# Patient Record
Sex: Male | Born: 1935 | Race: White | Hispanic: No | Marital: Married | Smoking: Former smoker
Health system: Southern US, Community
[De-identification: ages and names within clinical notes are randomized; demographics above are authoritative.]

## PROBLEM LIST (undated history)

## (undated) DIAGNOSIS — R42 Dizziness and giddiness: Secondary | ICD-10-CM

## (undated) DIAGNOSIS — G4730 Sleep apnea, unspecified: Secondary | ICD-10-CM

## (undated) DIAGNOSIS — I739 Peripheral vascular disease, unspecified: Secondary | ICD-10-CM

## (undated) DIAGNOSIS — T4145XA Adverse effect of unspecified anesthetic, initial encounter: Secondary | ICD-10-CM

## (undated) DIAGNOSIS — M1990 Unspecified osteoarthritis, unspecified site: Secondary | ICD-10-CM

## (undated) DIAGNOSIS — M25471 Effusion, right ankle: Secondary | ICD-10-CM

## (undated) DIAGNOSIS — I2699 Other pulmonary embolism without acute cor pulmonale: Secondary | ICD-10-CM

## (undated) DIAGNOSIS — G709 Myoneural disorder, unspecified: Secondary | ICD-10-CM

## (undated) DIAGNOSIS — T8859XA Other complications of anesthesia, initial encounter: Secondary | ICD-10-CM

## (undated) DIAGNOSIS — N189 Chronic kidney disease, unspecified: Secondary | ICD-10-CM

## (undated) DIAGNOSIS — I10 Essential (primary) hypertension: Secondary | ICD-10-CM

## (undated) DIAGNOSIS — M25472 Effusion, left ankle: Secondary | ICD-10-CM

## (undated) HISTORY — PX: ELBOW SURGERY: SHX618

## (undated) HISTORY — PX: APPENDECTOMY: SHX54

## (undated) HISTORY — PX: CARDIAC CATHETERIZATION: SHX172

## (undated) HISTORY — PX: CHOLECYSTECTOMY: SHX55

## (undated) HISTORY — PX: KNEE ARTHROSCOPY: SHX127

## (undated) HISTORY — PX: JOINT REPLACEMENT: SHX530

## (undated) HISTORY — PX: HEMORROIDECTOMY: SUR656

## (undated) HISTORY — PX: CARPAL TUNNEL RELEASE: SHX101

---

## 1998-09-27 ENCOUNTER — Encounter: Admission: RE | Admit: 1998-09-27 | Discharge: 1998-12-26 | Payer: Self-pay | Admitting: Family Medicine

## 2000-01-17 ENCOUNTER — Encounter: Admission: RE | Admit: 2000-01-17 | Discharge: 2000-01-17 | Payer: Self-pay | Admitting: Family Medicine

## 2000-01-17 ENCOUNTER — Encounter: Payer: Self-pay | Admitting: Family Medicine

## 2001-08-03 ENCOUNTER — Emergency Department (HOSPITAL_COMMUNITY): Admission: EM | Admit: 2001-08-03 | Discharge: 2001-08-04 | Payer: Self-pay | Admitting: Emergency Medicine

## 2001-12-30 ENCOUNTER — Ambulatory Visit (HOSPITAL_COMMUNITY): Admission: RE | Admit: 2001-12-30 | Discharge: 2001-12-30 | Payer: Self-pay | Admitting: Gastroenterology

## 2001-12-30 ENCOUNTER — Encounter (INDEPENDENT_AMBULATORY_CARE_PROVIDER_SITE_OTHER): Payer: Self-pay | Admitting: *Deleted

## 2003-11-26 ENCOUNTER — Emergency Department (HOSPITAL_COMMUNITY): Admission: EM | Admit: 2003-11-26 | Discharge: 2003-11-26 | Payer: Self-pay | Admitting: Family Medicine

## 2003-11-29 ENCOUNTER — Emergency Department (HOSPITAL_COMMUNITY): Admission: AD | Admit: 2003-11-29 | Discharge: 2003-11-29 | Payer: Self-pay | Admitting: Family Medicine

## 2004-03-08 ENCOUNTER — Ambulatory Visit (HOSPITAL_COMMUNITY): Admission: RE | Admit: 2004-03-08 | Discharge: 2004-03-08 | Payer: Self-pay | Admitting: Chiropractic Medicine

## 2004-10-07 ENCOUNTER — Emergency Department (HOSPITAL_COMMUNITY): Admission: EM | Admit: 2004-10-07 | Discharge: 2004-10-07 | Payer: Self-pay | Admitting: Family Medicine

## 2005-04-04 ENCOUNTER — Encounter: Admission: RE | Admit: 2005-04-04 | Discharge: 2005-04-04 | Payer: Self-pay | Admitting: Family Medicine

## 2005-08-03 ENCOUNTER — Encounter: Admission: RE | Admit: 2005-08-03 | Discharge: 2005-08-03 | Payer: Self-pay | Admitting: Family Medicine

## 2006-04-19 ENCOUNTER — Encounter: Admission: RE | Admit: 2006-04-19 | Discharge: 2006-04-19 | Payer: Self-pay | Admitting: Family Medicine

## 2008-07-20 ENCOUNTER — Inpatient Hospital Stay (HOSPITAL_COMMUNITY): Admission: EM | Admit: 2008-07-20 | Discharge: 2008-07-23 | Payer: Self-pay | Admitting: Emergency Medicine

## 2009-10-13 ENCOUNTER — Inpatient Hospital Stay (HOSPITAL_COMMUNITY): Admission: RE | Admit: 2009-10-13 | Discharge: 2009-10-24 | Payer: Self-pay | Admitting: Orthopedic Surgery

## 2010-03-19 ENCOUNTER — Observation Stay (HOSPITAL_COMMUNITY): Admission: EM | Admit: 2010-03-19 | Discharge: 2010-03-22 | Payer: Self-pay | Admitting: Emergency Medicine

## 2010-03-21 ENCOUNTER — Encounter (INDEPENDENT_AMBULATORY_CARE_PROVIDER_SITE_OTHER): Payer: Self-pay | Admitting: Cardiovascular Disease

## 2010-10-28 LAB — URINE MICROSCOPIC-ADD ON

## 2010-10-28 LAB — CBC
HCT: 37.7 % — ABNORMAL LOW (ref 39.0–52.0)
HCT: 38.9 % — ABNORMAL LOW (ref 39.0–52.0)
Hemoglobin: 12.1 g/dL — ABNORMAL LOW (ref 13.0–17.0)
Hemoglobin: 12.9 g/dL — ABNORMAL LOW (ref 13.0–17.0)
MCH: 27.2 pg (ref 26.0–34.0)
MCH: 27.8 pg (ref 26.0–34.0)
MCH: 28.2 pg (ref 26.0–34.0)
MCHC: 32 g/dL (ref 30.0–36.0)
MCV: 84.9 fL (ref 78.0–100.0)
Platelets: 104 10*3/uL — ABNORMAL LOW (ref 150–400)
Platelets: 108 10*3/uL — ABNORMAL LOW (ref 150–400)
RBC: 4.26 MIL/uL (ref 4.22–5.81)
RBC: 4.36 MIL/uL (ref 4.22–5.81)
RBC: 4.58 MIL/uL (ref 4.22–5.81)

## 2010-10-28 LAB — BASIC METABOLIC PANEL
CO2: 24 mEq/L (ref 19–32)
CO2: 28 mEq/L (ref 19–32)
CO2: 29 mEq/L (ref 19–32)
Calcium: 8.1 mg/dL — ABNORMAL LOW (ref 8.4–10.5)
Calcium: 8.4 mg/dL (ref 8.4–10.5)
Calcium: 8.4 mg/dL (ref 8.4–10.5)
Chloride: 106 mEq/L (ref 96–112)
Creatinine, Ser: 1.49 mg/dL (ref 0.4–1.5)
GFR calc Af Amer: 42 mL/min — ABNORMAL LOW (ref >60)
GFR calc Af Amer: 46 mL/min — ABNORMAL LOW (ref >60)
GFR calc Af Amer: 53 mL/min — ABNORMAL LOW (ref >60)
GFR calc Af Amer: 56 mL/min — ABNORMAL LOW (ref >60)
GFR calc non Af Amer: 38 mL/min — ABNORMAL LOW (ref >60)
Glucose, Bld: 101 mg/dL — ABNORMAL HIGH (ref 70–99)
Potassium: 3.4 mEq/L — ABNORMAL LOW (ref 3.5–5.1)
Potassium: 3.7 mEq/L (ref 3.5–5.1)
Sodium: 141 mEq/L (ref 135–145)
Sodium: 142 mEq/L (ref 135–145)

## 2010-10-28 LAB — MAGNESIUM: Magnesium: 2.1 mg/dL (ref 1.5–2.5)

## 2010-10-28 LAB — URINE CULTURE
Colony Count: NO GROWTH
Culture  Setup Time: 201108071207

## 2010-10-28 LAB — URINALYSIS, ROUTINE W REFLEX MICROSCOPIC
Bilirubin Urine: NEGATIVE
Glucose, UA: NEGATIVE mg/dL
Ketones, ur: NEGATIVE mg/dL
Nitrite: NEGATIVE
Protein, ur: 100 mg/dL — AB
pH: 5 (ref 5.0–8.0)

## 2010-10-28 LAB — GLUCOSE, CAPILLARY
Glucose-Capillary: 173 mg/dL — ABNORMAL HIGH (ref 70–99)
Glucose-Capillary: 174 mg/dL — ABNORMAL HIGH (ref 70–99)
Glucose-Capillary: 249 mg/dL — ABNORMAL HIGH (ref 70–99)
Glucose-Capillary: 75 mg/dL (ref 70–99)
Glucose-Capillary: 96 mg/dL (ref 70–99)

## 2010-10-28 LAB — CARDIAC PANEL(CRET KIN+CKTOT+MB+TROPI)
CK, MB: 3.2 ng/mL (ref 0.3–4.0)
Total CK: 204 U/L (ref 7–232)
Troponin I: 0.01 ng/mL (ref 0.00–0.06)

## 2010-10-28 LAB — COMPREHENSIVE METABOLIC PANEL
Albumin: 3.4 g/dL — ABNORMAL LOW (ref 3.5–5.2)
BUN: 29 mg/dL — ABNORMAL HIGH (ref 6–23)
CO2: 28 mEq/L (ref 19–32)
Calcium: 8.4 mg/dL (ref 8.4–10.5)
Chloride: 103 mEq/L (ref 96–112)
Glucose, Bld: 168 mg/dL — ABNORMAL HIGH (ref 70–99)
Potassium: 4.4 mEq/L (ref 3.5–5.1)
Sodium: 139 mEq/L (ref 135–145)
Total Bilirubin: 0.9 mg/dL (ref 0.3–1.2)
Total Protein: 6.2 g/dL (ref 6.0–8.3)

## 2010-10-28 LAB — DIFFERENTIAL
Basophils Absolute: 0 10*3/uL (ref 0.0–0.1)
Basophils Relative: 0 % (ref 0–1)
Eosinophils Absolute: 0.1 10*3/uL (ref 0.0–0.7)
Lymphs Abs: 0.7 10*3/uL (ref 0.7–4.0)
Monocytes Absolute: 0.4 10*3/uL (ref 0.1–1.0)
Monocytes Relative: 6 % (ref 3–12)
Neutro Abs: 6 10*3/uL (ref 1.7–7.7)

## 2010-10-28 LAB — PROTIME-INR
INR: 2.55 — ABNORMAL HIGH (ref 0.00–1.49)
Prothrombin Time: 28.2 seconds — ABNORMAL HIGH (ref 11.6–15.2)

## 2010-10-28 LAB — D-DIMER, QUANTITATIVE: D-Dimer, Quant: 0.38 ug/mL-FEU (ref 0.00–0.48)

## 2010-10-28 LAB — CK TOTAL AND CKMB (NOT AT ARMC)
Relative Index: 2 (ref 0.0–2.5)
Total CK: 177 U/L (ref 7–232)

## 2010-10-28 LAB — POCT CARDIAC MARKERS
Myoglobin, poc: 252 ng/mL (ref 12–200)
Troponin i, poc: <0.05 ng/mL (ref 0.00–0.09)
Troponin i, poc: <0.05 ng/mL (ref 0.00–0.09)

## 2010-11-04 LAB — URINALYSIS, ROUTINE W REFLEX MICROSCOPIC
Bilirubin Urine: NEGATIVE
Ketones, ur: NEGATIVE mg/dL
Nitrite: NEGATIVE
pH: 5.5 (ref 5.0–8.0)

## 2010-11-04 LAB — COMPREHENSIVE METABOLIC PANEL
AST: 15 U/L (ref 0–37)
BUN: 17 mg/dL (ref 6–23)
CO2: 31 mEq/L (ref 19–32)
Chloride: 105 mEq/L (ref 96–112)
Creatinine, Ser: 1.67 mg/dL — ABNORMAL HIGH (ref 0.4–1.5)
GFR calc non Af Amer: 40 mL/min — ABNORMAL LOW (ref >60)
Total Bilirubin: 1 mg/dL (ref 0.3–1.2)

## 2010-11-04 LAB — CBC
HCT: 42.5 % (ref 39.0–52.0)
MCV: 89.6 fL (ref 78.0–100.0)
RBC: 4.75 MIL/uL (ref 4.22–5.81)
WBC: 5.1 10*3/uL (ref 4.0–10.5)

## 2010-11-04 LAB — PROTIME-INR: INR: 2.38 — ABNORMAL HIGH (ref 0.00–1.49)

## 2010-11-04 LAB — APTT: aPTT: 33 seconds (ref 24–37)

## 2010-11-04 LAB — URINE MICROSCOPIC-ADD ON

## 2010-11-07 LAB — PROTIME-INR
INR: 1.93 — ABNORMAL HIGH (ref 0.00–1.49)
INR: 2.12 — ABNORMAL HIGH (ref 0.00–1.49)
INR: 2.68 — ABNORMAL HIGH (ref 0.00–1.49)
INR: 3.34 — ABNORMAL HIGH (ref 0.00–1.49)
Prothrombin Time: 15 seconds (ref 11.6–15.2)
Prothrombin Time: 16.7 seconds — ABNORMAL HIGH (ref 11.6–15.2)
Prothrombin Time: 18.9 seconds — ABNORMAL HIGH (ref 11.6–15.2)
Prothrombin Time: 21.9 seconds — ABNORMAL HIGH (ref 11.6–15.2)
Prothrombin Time: 23.6 seconds — ABNORMAL HIGH (ref 11.6–15.2)
Prothrombin Time: 25.2 seconds — ABNORMAL HIGH (ref 11.6–15.2)
Prothrombin Time: 28.3 seconds — ABNORMAL HIGH (ref 11.6–15.2)
Prothrombin Time: 33.6 seconds — ABNORMAL HIGH (ref 11.6–15.2)

## 2010-11-07 LAB — PREPARE RBC (CROSSMATCH)

## 2010-11-07 LAB — GLUCOSE, CAPILLARY
Glucose-Capillary: 124 mg/dL — ABNORMAL HIGH (ref 70–99)
Glucose-Capillary: 128 mg/dL — ABNORMAL HIGH (ref 70–99)
Glucose-Capillary: 132 mg/dL — ABNORMAL HIGH (ref 70–99)
Glucose-Capillary: 134 mg/dL — ABNORMAL HIGH (ref 70–99)
Glucose-Capillary: 135 mg/dL — ABNORMAL HIGH (ref 70–99)
Glucose-Capillary: 143 mg/dL — ABNORMAL HIGH (ref 70–99)
Glucose-Capillary: 147 mg/dL — ABNORMAL HIGH (ref 70–99)
Glucose-Capillary: 147 mg/dL — ABNORMAL HIGH (ref 70–99)
Glucose-Capillary: 153 mg/dL — ABNORMAL HIGH (ref 70–99)
Glucose-Capillary: 154 mg/dL — ABNORMAL HIGH (ref 70–99)
Glucose-Capillary: 157 mg/dL — ABNORMAL HIGH (ref 70–99)
Glucose-Capillary: 167 mg/dL — ABNORMAL HIGH (ref 70–99)
Glucose-Capillary: 170 mg/dL — ABNORMAL HIGH (ref 70–99)
Glucose-Capillary: 178 mg/dL — ABNORMAL HIGH (ref 70–99)
Glucose-Capillary: 181 mg/dL — ABNORMAL HIGH (ref 70–99)
Glucose-Capillary: 186 mg/dL — ABNORMAL HIGH (ref 70–99)
Glucose-Capillary: 202 mg/dL — ABNORMAL HIGH (ref 70–99)
Glucose-Capillary: 207 mg/dL — ABNORMAL HIGH (ref 70–99)
Glucose-Capillary: 249 mg/dL — ABNORMAL HIGH (ref 70–99)

## 2010-11-07 LAB — BASIC METABOLIC PANEL
BUN: 17 mg/dL (ref 6–23)
BUN: 23 mg/dL (ref 6–23)
BUN: 26 mg/dL — ABNORMAL HIGH (ref 6–23)
BUN: 29 mg/dL — ABNORMAL HIGH (ref 6–23)
BUN: 37 mg/dL — ABNORMAL HIGH (ref 6–23)
CO2: 27 mEq/L (ref 19–32)
CO2: 27 mEq/L (ref 19–32)
CO2: 29 mEq/L (ref 19–32)
CO2: 29 mEq/L (ref 19–32)
CO2: 30 mEq/L (ref 19–32)
CO2: 31 mEq/L (ref 19–32)
CO2: 32 mEq/L (ref 19–32)
Calcium: 7.8 mg/dL — ABNORMAL LOW (ref 8.4–10.5)
Calcium: 8 mg/dL — ABNORMAL LOW (ref 8.4–10.5)
Calcium: 8.1 mg/dL — ABNORMAL LOW (ref 8.4–10.5)
Calcium: 8.4 mg/dL (ref 8.4–10.5)
Calcium: 8.4 mg/dL (ref 8.4–10.5)
Chloride: 103 mEq/L (ref 96–112)
Chloride: 104 mEq/L (ref 96–112)
Chloride: 105 mEq/L (ref 96–112)
Chloride: 105 mEq/L (ref 96–112)
Chloride: 105 mEq/L (ref 96–112)
Chloride: 106 mEq/L (ref 96–112)
Chloride: 106 mEq/L (ref 96–112)
Chloride: 109 mEq/L (ref 96–112)
Chloride: 111 mEq/L (ref 96–112)
Creatinine, Ser: 1.59 mg/dL — ABNORMAL HIGH (ref 0.4–1.5)
Creatinine, Ser: 2.13 mg/dL — ABNORMAL HIGH (ref 0.4–1.5)
Creatinine, Ser: 2.26 mg/dL — ABNORMAL HIGH (ref 0.4–1.5)
Creatinine, Ser: 2.36 mg/dL — ABNORMAL HIGH (ref 0.4–1.5)
Creatinine, Ser: 2.37 mg/dL — ABNORMAL HIGH (ref 0.4–1.5)
GFR calc Af Amer: 33 mL/min — ABNORMAL LOW (ref >60)
GFR calc Af Amer: 34 mL/min — ABNORMAL LOW (ref >60)
GFR calc Af Amer: 37 mL/min — ABNORMAL LOW (ref >60)
GFR calc Af Amer: 37 mL/min — ABNORMAL LOW (ref >60)
GFR calc Af Amer: 43 mL/min — ABNORMAL LOW (ref >60)
GFR calc Af Amer: 45 mL/min — ABNORMAL LOW (ref >60)
GFR calc Af Amer: 55 mL/min — ABNORMAL LOW (ref >60)
GFR calc non Af Amer: 31 mL/min — ABNORMAL LOW (ref >60)
GFR calc non Af Amer: 35 mL/min — ABNORMAL LOW (ref >60)
GFR calc non Af Amer: 46 mL/min — ABNORMAL LOW (ref >60)
Glucose, Bld: 144 mg/dL — ABNORMAL HIGH (ref 70–99)
Glucose, Bld: 149 mg/dL — ABNORMAL HIGH (ref 70–99)
Glucose, Bld: 161 mg/dL — ABNORMAL HIGH (ref 70–99)
Glucose, Bld: 167 mg/dL — ABNORMAL HIGH (ref 70–99)
Glucose, Bld: 197 mg/dL — ABNORMAL HIGH (ref 70–99)
Glucose, Bld: 220 mg/dL — ABNORMAL HIGH (ref 70–99)
Potassium: 2.9 mEq/L — ABNORMAL LOW (ref 3.5–5.1)
Potassium: 3 mEq/L — ABNORMAL LOW (ref 3.5–5.1)
Potassium: 3.2 mEq/L — ABNORMAL LOW (ref 3.5–5.1)
Potassium: 3.6 mEq/L (ref 3.5–5.1)
Sodium: 139 mEq/L (ref 135–145)
Sodium: 140 mEq/L (ref 135–145)
Sodium: 140 mEq/L (ref 135–145)
Sodium: 146 mEq/L — ABNORMAL HIGH (ref 135–145)
Sodium: 150 mEq/L — ABNORMAL HIGH (ref 135–145)

## 2010-11-07 LAB — CBC
HCT: 28.8 % — ABNORMAL LOW (ref 39.0–52.0)
HCT: 33 % — ABNORMAL LOW (ref 39.0–52.0)
Hemoglobin: 11.8 g/dL — ABNORMAL LOW (ref 13.0–17.0)
Hemoglobin: 9.2 g/dL — ABNORMAL LOW (ref 13.0–17.0)
MCHC: 31.8 g/dL (ref 30.0–36.0)
MCHC: 32.2 g/dL (ref 30.0–36.0)
MCHC: 33.2 g/dL (ref 30.0–36.0)
MCHC: 33.7 g/dL (ref 30.0–36.0)
MCHC: 34.1 g/dL (ref 30.0–36.0)
MCV: 88.9 fL (ref 78.0–100.0)
MCV: 89.5 fL (ref 78.0–100.0)
MCV: 89.6 fL (ref 78.0–100.0)
MCV: 91 fL (ref 78.0–100.0)
MCV: 91.5 fL (ref 78.0–100.0)
MCV: 91.6 fL (ref 78.0–100.0)
Platelets: 161 10*3/uL (ref 150–400)
Platelets: 200 10*3/uL (ref 150–400)
Platelets: 93 10*3/uL — ABNORMAL LOW (ref 150–400)
RBC: 2.89 MIL/uL — ABNORMAL LOW (ref 4.22–5.81)
RBC: 3.05 MIL/uL — ABNORMAL LOW (ref 4.22–5.81)
RBC: 3.24 MIL/uL — ABNORMAL LOW (ref 4.22–5.81)
RBC: 3.44 MIL/uL — ABNORMAL LOW (ref 4.22–5.81)
RBC: 3.61 MIL/uL — ABNORMAL LOW (ref 4.22–5.81)
RBC: 3.63 MIL/uL — ABNORMAL LOW (ref 4.22–5.81)
RBC: 4.06 MIL/uL — ABNORMAL LOW (ref 4.22–5.81)
RDW: 13.7 % (ref 11.5–15.5)
RDW: 13.9 % (ref 11.5–15.5)
WBC: 5.6 10*3/uL (ref 4.0–10.5)
WBC: 6 10*3/uL (ref 4.0–10.5)
WBC: 6.2 10*3/uL (ref 4.0–10.5)
WBC: 7 10*3/uL (ref 4.0–10.5)

## 2010-11-07 LAB — TYPE AND SCREEN
ABO/RH(D): A NEG
Antibody Screen: NEGATIVE

## 2010-11-07 LAB — ABO/RH: ABO/RH(D): A NEG

## 2010-11-07 LAB — POCT I-STAT 4, (NA,K, GLUC, HGB,HCT)
Glucose, Bld: 169 mg/dL — ABNORMAL HIGH (ref 70–99)
HCT: 33 % — ABNORMAL LOW (ref 39.0–52.0)
Hemoglobin: 11.2 g/dL — ABNORMAL LOW (ref 13.0–17.0)
Potassium: 3.7 mEq/L (ref 3.5–5.1)
Sodium: 140 mEq/L (ref 135–145)

## 2010-12-27 NOTE — Discharge Summary (Signed)
NAME:  Chase [name] NO.:  [id number]   MEDICAL RECORD NO.:  [id number]          PATIENT TYPE:  INP   LOCATION:  5153                         FACILITY:  [facility]   PHYSICIAN:  Imogene Burn. Georgette Dover, M.D. DATE OF BIRTH:  1932-11-21   DATE OF ADMISSION:  07/20/2008  DATE OF DISCHARGE:  07/23/2008                               DISCHARGE SUMMARY   ADMITTING PHYSICIAN:  Merri Ray. Grandville Silos, MD   DISCHARGING PHYSICIAN:  Imogene Burn. Tsuei, MD   PROCEDURES:  None.   CONSULTANTS:  [facility].   REASON FOR ADMISSION:  Chase [name] is a 77 year old very pleasant man who  presents to [facility] Emergency Department with a 3-day history of  diarrhea and increasing abdominal distention that began after the  patient took colchicine for an episode of gout.  After he developed all  these diarrhea, he then took some Pepto-Bismol and became constipated.  After that his abdomen gradually became more distended and continued to  progress along with abdominal pain.  At that time, he presented to the  emergency department where he was evaluated and found to have a partial  small bowel obstruction.  Please see admitting history and physical for  further details.   ADMITTING DIAGNOSES:  1. Partial small bowel obstruction.  2. History of diabetes.  3. History of hypertension.  4. Currently on Coumadin.   HOSPITAL COURSE:  At this time, the patient was admitted and started on  IV fluids and an NG tube was placed to decompress the stomach and the  patient was placed on bowel rest.  At this time, we held his Coumadin in  case the patient may have eventually needed surgical intervention.  To  help [place] manage this along with his hypertension, we called [facility] to help keep the patient anticoagulated and follow  with [place] throughout the hospitalization.  At this time, the patient was  placed on heparin in place of his Coumadin.  On hospital day 1,  the  patient was beginning to feel better with NG tube in place.  He was much  less distended and had some bowel sounds, however, was hypoactive.  At  this time, his NG tube was continued as he was not passing flatus yet.  By hospital day 2, the patient was very hungry and at this time had  begun passing flatus.  Therefore at this time, his NG tube was clamped  and approximately 6 hours later was rechecked and was continuing to pass  flatus and was not having any nausea or vomiting with the NG tube  clamped.  Therefore at this time, this was removed and he was started on  clear liquid diet.  Also this time, his diet was advanced as tolerated  and by hospital day 3, the patient was having bowel movement and  tolerating a regular diet.  On exam, his abdomen was soft, nontender,  and nondistended with active bowel sounds.  At this time, the patient  was felt stable for discharge, however, since his Coumadin had been  held, his INR was currently subtherapeutic at 1.5 and therefore, he will  need to be placed on Lovenox at home until his INR becomes therapeutic  at approximately 2.5.   DISCHARGE DIAGNOSES:  1. Partial small bowel obstruction, which has resolved.  2. History of hypertension.  3. History of diabetes mellitus.  4. History of pulmonary embolus in 1973 as well as 1999 for which he      is on Coumadin.   DISCHARGE MEDICATIONS:  He may resume all home medications including,  1. Coumadin 7.5 mg 4 days a week.  Coumadin 10 mg 3 days a week.  2. Amaryl 6 mg once daily.  3. Lotrel 10/40 mg once daily.  4. Toprol-XL 100 mg daily.  5. Lipitor 40 mg daily.  6. Flomax 0.4 mg daily.  7. Tylenol 250 mg t.i.d.   NEW MEDICATION:  Lovenox 120 mg 1 injection subcu q.12 h.   DISCHARGE INSTRUCTIONS:  The patient is informed that he may resume a  regular diet.  He has no activity restriction.  He is informed that he  needs to follow up with [facility] on   Monday to have his PT and INR checked.  The patient is aware of this as  the cardiac PA has explained this to the patient.  Otherwise, the  patient does not need to follow up with [place] at our surgery office unless  he has a problem.  Otherwise, he will just need to continue following up  with his primary care physician and cardiologist as needed.      Henreitta Cea, PA      Imogene Burn. Tsuei, M.D.  Electronically Signed    KEO/MEDQ  D:  07/23/2008  T:  07/24/2008  Job:  [id number]   cc:   Osvaldo Human, M.D.  Chase Flesher, [place]  Jeryl Columbia, M.D.

## 2010-12-27 NOTE — H&P (Signed)
NAME:  Chase [name], Chase [name] NO.:  [id number]   MEDICAL RECORD NO.:  [id number]          PATIENT TYPE:  EMS   LOCATION:  MAJO                         FACILITY:  [facility]   PHYSICIAN:  Merri Ray. Grandville Silos, M.D.DATE OF BIRTH:  October 07, 1932   DATE OF ADMISSION:  07/20/2008  DATE OF DISCHARGE:                              HISTORY & PHYSICAL   CHIEF COMPLAINT:  Abdominal distention with alternating diarrhea and  constipation.   HISTORY OF PRESENT ILLNESS:  Chase [name] is a very pleasant 77-year-  old gentleman who presented to [facility] Emergency Department today  with 3-day history of diarrhea and increasing abdominal distention that  started 3 days ago when he developed an attack of gout.  He took  colchicine and developed a profuse diarrhea.  He then took some Pepto-  Bismol and became constipated.  His abdomen gradually became more  distended.  He took a laxative today and did have a watery bowel  movement, but he has had further progression and increased abdominal  distention, reflux, and he developed some abdominal pain, and he came to  the emergency department for evaluation.  I was asked to see him in  regards to a bowel obstruction by Dr. Vanessa Kick from [facility].   PAST MEDICAL HISTORY:  Pulmonary embolism, diabetes mellitus, and  hypertension.   PAST SURGICAL HISTORY:  Laparoscopic cholecystectomy in 1999 and  appendectomy.   PRIMARY SOCIAL HISTORY:  He does not smoke or drink alcohol.  He lives  with his wife.   CURRENT MEDICATIONS:  Coumadin 7.5 mg alternating with 10 mg daily,  Amaryl 6 mg daily, Lotrel 10/40 mg daily, Toprol-XL 100 mg daily,  Lipitor 40 mg daily, Flomax 0.4 mg daily, Tylenol 250 mg t.i.d.   ALLERGIES:  No known drug allergies.   REVIEW OF SYSTEMS:  GASTROINTESTINAL:  As above.  MUSCULOSKELETAL:  Gouty attack.  CARDIOVASCULAR:  Some recent foot swelling.  The  remainder of the review of systems is  unremarkable.   PHYSICAL EXAMINATION:  VITAL SIGNS:  Temperature is 98.0, blood pressure  156/87, heart rate is 100, respirations 16.  GENERAL:  He is awake and alert.  HEENT:  Pupils equal, reactive.  Extraocular muscles are intact.  Oral  mucosa is dry but pink.  He has a nasogastric tube through his right  naris with bilious output.  NECK:  Supple with no tenderness.  LUNGS:  Clear to auscultation.  Respiratory effort is good.  No wheezing  is heard.  CARDIOVASCULAR:  Heart is regular with no murmurs.  Impulses palpable in  the left chest.  Distal pulses are 1+ with some trace peripheral edema  in bilateral lower extremities.  ABDOMEN:  Distended but soft.  He has very few bowel sounds present.  There is no discrete significant tenderness.  No masses or hernias are  felt.  No organomegaly is felt on palpation.  SKIN:  Warm and dry.  He does have chronic venous stasis changes in  bilateral lower extremities.   LABORATORY DATA:  White blood cell count 5.5, hemoglobin 14, and  platelets  124.  INR 1.9.  Abdominal series x-rays are consistent with  partial small-bowel obstruction.   PRIMARY IMPRESSION AND PLAN:  1. Likely adhesive partial small-bowel obstruction.  We will plan to      admit him to the hospital and continue nasogastric tube      decompression with bowel rest and IV fluids.  We will also plan to      hold his Coumadin, and I spoke with the physician on-call for Dr.      Gwenlyn Found at [facility] to follow him up in the      morning as he may need to be placed on heparinization during this      hospitalization.  We will also check a BMET both for electrolyte      and renal function.  2. History of diabetes and hypertension.  We will plan to manage these      with intravenous medications.  3. Plan was discussed in detail with the patient and his wife.      Questions were answered.      Merri Ray Grandville Silos, M.D.  Electronically Signed     BET/MEDQ   D:  07/20/2008  T:  07/21/2008  Job:  [id number]   cc:   Osvaldo Human, M.D.  Jeryl Columbia, M.D.  Micah Flesher, PA

## 2010-12-30 NOTE — Procedures (Signed)
[facility]  Patient:    BESNIK, CHARACTER Visit Number: [id number] MRN: [id number]          Service Type: END Location: ENDO Attending Physician:  Orvis Brill Dictated by:   Jeryl Columbia, M.D. Proc. Date: 12/30/01 Admit Date:  12/30/2001 Discharge Date: 12/30/2001   CC:         Lorretta Harp, M.D.  Lindwood Qua, M.D.   Procedure Report  PROCEDURE:  Colonoscopy.  INDICATION:  Patient with history of colon polyps, overdue for colonic screening.  Consent was signed after risks, methods, benefits, and options thoroughly discussed multiple times in the past.  MEDICATIONS:  Demerol 60 mg, Versed 5 mg.  DESCRIPTION OF PROCEDURE:  Rectal inspection was pertinent for small external hemorrhoids.  Digital exam was negative.  The video colonoscope was inserted, easily advanced around the colon to the cecum.  This did require some abdominal pressure and no position changes.  No obvious abnormality was seen on insertion.  The cecum was entered as identified by the appendiceal orifice and the ileocecal valve.  Prep was adequate.  There was some liquid stool that required washing and suctioning.  On slow withdrawal through the colon, cecum, ascending, and transverse were normal.  In the more proximal descending, a tiny 1-2 mm polyp was seen and was cold biopsied x2.  The scope was further withdrawn.  There was a rare left-sided diverticulum and two other tiny polyps were seen, one in the sigmoid, one in the rectum.  They were also cold biopsied and put in the same container.  The scope was retroflexed, pertinent for some small internal hemorrhoids.  The scope was straightened and readvanced a short way up the left side of the colon, air was suctioned, and scope removed.  The patient tolerated the procedure well.  There was no obvious immediate complication.  ENDOSCOPIC DIAGNOSES: 1. Internal and external hemorrhoid. 2. Rare  left-sided diverticula. 3. Three tiny polyps, cold biopsied, in the rectum, sigmoid, and proximal    descending. 4. Otherwise within normal limits to the cecum.  PLAN:  Await pathology, but probably recheck colon screening in five years. Happy to see back p.r.n.  Otherwise return care to Drs. Gwenlyn Found and [facility] [place] for the customary health care maintenance to include yearly rectals and guaiacs. Okay to restart Coumadin, and will touch base with Dr. Rachel Bo office regarding whether he needs to restart his Lovenox as well. Dictated by:   Jeryl Columbia, M.D. Attending Physician:  Orvis Brill DD:  12/30/01 TD:  01/01/02 Job: [id number]

## 2011-03-06 ENCOUNTER — Encounter (HOSPITAL_BASED_OUTPATIENT_CLINIC_OR_DEPARTMENT_OTHER)
Admission: RE | Admit: 2011-03-06 | Discharge: 2011-03-06 | Disposition: A | Discharge status: Home / Self Care | Payer: Medicare Other | Source: Ambulatory Visit | Attending: Orthopedic Surgery | Admitting: Orthopedic Surgery

## 2011-03-06 LAB — BASIC METABOLIC PANEL
BUN: 24 mg/dL — ABNORMAL HIGH (ref 6–23)
CO2: 30 mEq/L (ref 19–32)
Chloride: 105 mEq/L (ref 96–112)
Creatinine, Ser: 1.95 mg/dL — ABNORMAL HIGH (ref 0.50–1.35)
GFR calc Af Amer: 41 mL/min — ABNORMAL LOW (ref >60)
Glucose, Bld: 154 mg/dL — ABNORMAL HIGH (ref 70–99)
Potassium: 4.7 mEq/L (ref 3.5–5.1)

## 2011-03-07 ENCOUNTER — Ambulatory Visit (HOSPITAL_BASED_OUTPATIENT_CLINIC_OR_DEPARTMENT_OTHER)
Admission: RE | Admit: 2011-03-07 | Discharge: 2011-03-07 | Disposition: A | Discharge status: Home / Self Care | Payer: Medicare Other | Source: Ambulatory Visit | Attending: Orthopedic Surgery | Admitting: Orthopedic Surgery

## 2011-03-07 DIAGNOSIS — N289 Disorder of kidney and ureter, unspecified: Secondary | ICD-10-CM | POA: Insufficient documentation

## 2011-03-07 DIAGNOSIS — E119 Type 2 diabetes mellitus without complications: Secondary | ICD-10-CM | POA: Insufficient documentation

## 2011-03-07 DIAGNOSIS — G4733 Obstructive sleep apnea (adult) (pediatric): Secondary | ICD-10-CM | POA: Insufficient documentation

## 2011-03-07 DIAGNOSIS — Z01812 Encounter for preprocedural laboratory examination: Secondary | ICD-10-CM | POA: Insufficient documentation

## 2011-03-07 DIAGNOSIS — G5600 Carpal tunnel syndrome, unspecified upper limb: Secondary | ICD-10-CM | POA: Insufficient documentation

## 2011-03-07 DIAGNOSIS — I2510 Atherosclerotic heart disease of native coronary artery without angina pectoris: Secondary | ICD-10-CM | POA: Insufficient documentation

## 2011-03-07 DIAGNOSIS — E669 Obesity, unspecified: Secondary | ICD-10-CM | POA: Insufficient documentation

## 2011-03-07 DIAGNOSIS — I10 Essential (primary) hypertension: Secondary | ICD-10-CM | POA: Insufficient documentation

## 2011-03-07 DIAGNOSIS — Z7901 Long term (current) use of anticoagulants: Secondary | ICD-10-CM | POA: Insufficient documentation

## 2011-03-07 LAB — GLUCOSE, CAPILLARY: Glucose-Capillary: 162 mg/dL — ABNORMAL HIGH (ref 70–99)

## 2011-05-16 NOTE — Op Note (Signed)
  NAME:  Chase [name], Chase [name] NO.:  [id number]  MEDICAL RECORD NO.:  [id number]  LOCATION:                                 FACILITY:  PHYSICIAN:  Daryll Brod, M.D.            DATE OF BIRTH:  DATE OF PROCEDURE:  03/07/2011 DATE OF DISCHARGE:                              OPERATIVE REPORT   PREOPERATIVE DIAGNOSIS:  Carpal tunnel syndrome, right hand.  POSTOPERATIVE DIAGNOSIS:  Carpal tunnel syndrome, right hand.  OPERATION:  Decompression, right median nerve.  SURGEON:  Daryll Brod, MD  ANESTHESIA:  Forearm based IV regional with local infiltration, 0.25% Marcaine with epinephrine.  ANESTHESIOLOGIST:  Leda Quail, MD  HISTORY:  The patient is a 77 year old male who is suffering from the carpal tunnel syndrome.  Pre, peri, postoperative course have been discussed along with risks and complications.  He is aware that there is no guarantee with surgery, possibility of infection, recurrence of injury to arteries, nerves, tendons, incomplete relief of symptoms, dystrophy.  In the preoperative area, the patient is seen.  The extremity marked by both the patient and surgeon.  He has elected to undergo surgical decompression.  PROCEDURE:  The patient is brought to the operating room where a general anesthetic was carried out without difficulty, was prepped using ChloraPrep, supine position with the right arm free.  A 3-minute dry time was allowed.  Time-out taken confirming the patient and procedure. A longitudinal incision was made in the palm, carried down through subcutaneous tissue.  Bleeders were electrocauterized, palmar fascia was split, superficial palmar arch identified, flexor tendon of the ring and little finger identified to the ulnar side of median nerve.  Carpal retinaculum was incised with sharp dissection, right angle and Sewall retractor were placed between skin and forearm fascia.  Fascia was released for approximately a centimeter and half proximal to the  wrist crease under direct vision.  Bleeders were electrocauterized throughout the procedure.  The wound was copiously irrigated with saline.  The skin was then closed interrupted 4-0 Vicryl Rapide suture.  A local infiltration with 0.25% Marcaine with epinephrine was then injected.  A sterile compressive dressing was applied.  No splint.  On deflation of the tourniquet, all fingers immediately pinked.  He was taken to the recovery room for observation in satisfactory condition.  He will be discharged home to return to [place] in 1 week, on Vicodin.          ______________________________ Daryll Brod, M.D.     GK/MEDQ  D:  03/07/2011  T:  03/07/2011  Job:  [id number]  cc:   Mayra Neer, M.D.  Electronically Signed by Daryll Brod M.D. on 05/16/2011 04:35:59 PM

## 2011-05-18 LAB — CBC
HCT: 35.4 % — ABNORMAL LOW (ref 39.0–52.0)
HCT: 38.7 % — ABNORMAL LOW (ref 39.0–52.0)
HCT: 42.4 % (ref 39.0–52.0)
Hemoglobin: 11.9 g/dL — ABNORMAL LOW (ref 13.0–17.0)
Hemoglobin: 13.1 g/dL (ref 13.0–17.0)
Hemoglobin: 14 g/dL (ref 13.0–17.0)
MCHC: 33.1 g/dL (ref 30.0–36.0)
MCV: 89.1 fL (ref 78.0–100.0)
MCV: 89.6 fL (ref 78.0–100.0)
Platelets: 117 10*3/uL — ABNORMAL LOW (ref 150–400)
Platelets: 118 10*3/uL — ABNORMAL LOW (ref 150–400)
Platelets: 124 10*3/uL — ABNORMAL LOW (ref 150–400)
RBC: 3.98 MIL/uL — ABNORMAL LOW (ref 4.22–5.81)
RBC: 4.46 MIL/uL (ref 4.22–5.81)
RBC: 4.76 MIL/uL (ref 4.22–5.81)
RDW: 14.5 % (ref 11.5–15.5)
WBC: 4.6 10*3/uL (ref 4.0–10.5)
WBC: 5.3 10*3/uL (ref 4.0–10.5)
WBC: 5.5 10*3/uL (ref 4.0–10.5)
WBC: 5.5 10*3/uL (ref 4.0–10.5)

## 2011-05-18 LAB — GLUCOSE, CAPILLARY
Glucose-Capillary: 102 mg/dL — ABNORMAL HIGH (ref 70–99)
Glucose-Capillary: 106 mg/dL — ABNORMAL HIGH (ref 70–99)
Glucose-Capillary: 107 mg/dL — ABNORMAL HIGH (ref 70–99)
Glucose-Capillary: 114 mg/dL — ABNORMAL HIGH (ref 70–99)
Glucose-Capillary: 129 mg/dL — ABNORMAL HIGH (ref 70–99)
Glucose-Capillary: 131 mg/dL — ABNORMAL HIGH (ref 70–99)
Glucose-Capillary: 132 mg/dL — ABNORMAL HIGH (ref 70–99)
Glucose-Capillary: 148 mg/dL — ABNORMAL HIGH (ref 70–99)

## 2011-05-18 LAB — BASIC METABOLIC PANEL
CO2: 22 mEq/L (ref 19–32)
Calcium: 8.4 mg/dL (ref 8.4–10.5)
Calcium: 8.6 mg/dL (ref 8.4–10.5)
GFR calc Af Amer: 43 mL/min — ABNORMAL LOW (ref >60)
GFR calc Af Amer: 47 mL/min — ABNORMAL LOW (ref >60)
GFR calc non Af Amer: 35 mL/min — ABNORMAL LOW (ref >60)
GFR calc non Af Amer: 39 mL/min — ABNORMAL LOW (ref >60)
Potassium: 3.4 mEq/L — ABNORMAL LOW (ref 3.5–5.1)
Potassium: 4 mEq/L (ref 3.5–5.1)
Sodium: 138 mEq/L (ref 135–145)
Sodium: 143 mEq/L (ref 135–145)

## 2011-05-18 LAB — DIFFERENTIAL
Basophils Absolute: 0 10*3/uL (ref 0.0–0.1)
Basophils Relative: 0 % (ref 0–1)
Eosinophils Absolute: 0.1 10*3/uL (ref 0.0–0.7)
Eosinophils Relative: 2 % (ref 0–5)
Lymphocytes Relative: 19 % (ref 12–46)
Lymphs Abs: 1.1 10*3/uL (ref 0.7–4.0)
Monocytes Absolute: 0.6 10*3/uL (ref 0.1–1.0)
Monocytes Relative: 11 % (ref 3–12)
Neutro Abs: 3.7 10*3/uL (ref 1.7–7.7)
Neutrophils Relative %: 68 % (ref 43–77)

## 2011-05-18 LAB — PROTIME-INR
INR: 1.5 (ref 0.00–1.49)
INR: 1.7 — ABNORMAL HIGH (ref 0.00–1.49)
INR: 1.9 — ABNORMAL HIGH (ref 0.00–1.49)
Prothrombin Time: 19.2 seconds — ABNORMAL HIGH (ref 11.6–15.2)
Prothrombin Time: 20.8 seconds — ABNORMAL HIGH (ref 11.6–15.2)
Prothrombin Time: 23.2 seconds — ABNORMAL HIGH (ref 11.6–15.2)

## 2011-05-18 LAB — HEPARIN LEVEL (UNFRACTIONATED): Heparin Unfractionated: 0.75 IU/mL — ABNORMAL HIGH (ref 0.30–0.70)

## 2011-08-17 DIAGNOSIS — I4891 Unspecified atrial fibrillation: Secondary | ICD-10-CM | POA: Diagnosis not present

## 2011-08-17 DIAGNOSIS — Z7901 Long term (current) use of anticoagulants: Secondary | ICD-10-CM | POA: Diagnosis not present

## 2011-08-24 DIAGNOSIS — M6530 Trigger finger, unspecified finger: Secondary | ICD-10-CM | POA: Diagnosis not present

## 2011-08-31 DIAGNOSIS — M169 Osteoarthritis of hip, unspecified: Secondary | ICD-10-CM | POA: Diagnosis not present

## 2011-08-31 DIAGNOSIS — M545 Low back pain: Secondary | ICD-10-CM | POA: Diagnosis not present

## 2011-09-06 DIAGNOSIS — M169 Osteoarthritis of hip, unspecified: Secondary | ICD-10-CM | POA: Diagnosis not present

## 2011-09-19 DIAGNOSIS — I4891 Unspecified atrial fibrillation: Secondary | ICD-10-CM | POA: Diagnosis not present

## 2011-09-19 DIAGNOSIS — M545 Low back pain: Secondary | ICD-10-CM | POA: Diagnosis not present

## 2011-09-19 DIAGNOSIS — Z7901 Long term (current) use of anticoagulants: Secondary | ICD-10-CM | POA: Diagnosis not present

## 2011-09-28 DIAGNOSIS — M6530 Trigger finger, unspecified finger: Secondary | ICD-10-CM | POA: Diagnosis not present

## 2011-10-10 DIAGNOSIS — M545 Low back pain: Secondary | ICD-10-CM | POA: Diagnosis not present

## 2011-10-14 DIAGNOSIS — M545 Low back pain: Secondary | ICD-10-CM | POA: Diagnosis not present

## 2011-10-18 DIAGNOSIS — M545 Low back pain: Secondary | ICD-10-CM | POA: Diagnosis not present

## 2011-10-18 DIAGNOSIS — Z7901 Long term (current) use of anticoagulants: Secondary | ICD-10-CM | POA: Diagnosis not present

## 2011-11-07 DIAGNOSIS — I82409 Acute embolism and thrombosis of unspecified deep veins of unspecified lower extremity: Secondary | ICD-10-CM | POA: Diagnosis not present

## 2011-11-07 DIAGNOSIS — I2699 Other pulmonary embolism without acute cor pulmonale: Secondary | ICD-10-CM | POA: Diagnosis not present

## 2011-11-07 DIAGNOSIS — Z7901 Long term (current) use of anticoagulants: Secondary | ICD-10-CM | POA: Diagnosis not present

## 2011-11-07 DIAGNOSIS — M961 Postlaminectomy syndrome, not elsewhere classified: Secondary | ICD-10-CM | POA: Diagnosis not present

## 2011-11-07 DIAGNOSIS — M542 Cervicalgia: Secondary | ICD-10-CM | POA: Diagnosis not present

## 2011-11-16 DIAGNOSIS — E782 Mixed hyperlipidemia: Secondary | ICD-10-CM | POA: Diagnosis not present

## 2011-12-05 DIAGNOSIS — Z7901 Long term (current) use of anticoagulants: Secondary | ICD-10-CM | POA: Diagnosis not present

## 2011-12-05 DIAGNOSIS — I2699 Other pulmonary embolism without acute cor pulmonale: Secondary | ICD-10-CM | POA: Diagnosis not present

## 2011-12-27 DIAGNOSIS — M4802 Spinal stenosis, cervical region: Secondary | ICD-10-CM | POA: Diagnosis not present

## 2011-12-27 DIAGNOSIS — R109 Unspecified abdominal pain: Secondary | ICD-10-CM | POA: Diagnosis not present

## 2012-01-04 DIAGNOSIS — M1710 Unilateral primary osteoarthritis, unspecified knee: Secondary | ICD-10-CM | POA: Diagnosis not present

## 2012-01-09 DIAGNOSIS — Z7901 Long term (current) use of anticoagulants: Secondary | ICD-10-CM | POA: Diagnosis not present

## 2012-01-09 DIAGNOSIS — I319 Disease of pericardium, unspecified: Secondary | ICD-10-CM | POA: Diagnosis not present

## 2012-01-19 DIAGNOSIS — E119 Type 2 diabetes mellitus without complications: Secondary | ICD-10-CM | POA: Diagnosis not present

## 2012-01-19 DIAGNOSIS — G4733 Obstructive sleep apnea (adult) (pediatric): Secondary | ICD-10-CM | POA: Diagnosis not present

## 2012-01-19 DIAGNOSIS — I10 Essential (primary) hypertension: Secondary | ICD-10-CM | POA: Diagnosis not present

## 2012-01-22 DIAGNOSIS — E782 Mixed hyperlipidemia: Secondary | ICD-10-CM | POA: Diagnosis not present

## 2012-01-22 DIAGNOSIS — E1129 Type 2 diabetes mellitus with other diabetic kidney complication: Secondary | ICD-10-CM | POA: Diagnosis not present

## 2012-01-22 DIAGNOSIS — G609 Hereditary and idiopathic neuropathy, unspecified: Secondary | ICD-10-CM | POA: Diagnosis not present

## 2012-01-22 DIAGNOSIS — I10 Essential (primary) hypertension: Secondary | ICD-10-CM | POA: Diagnosis not present

## 2012-02-06 DIAGNOSIS — I82409 Acute embolism and thrombosis of unspecified deep veins of unspecified lower extremity: Secondary | ICD-10-CM | POA: Diagnosis not present

## 2012-02-06 DIAGNOSIS — Z7901 Long term (current) use of anticoagulants: Secondary | ICD-10-CM | POA: Diagnosis not present

## 2012-02-06 DIAGNOSIS — I2699 Other pulmonary embolism without acute cor pulmonale: Secondary | ICD-10-CM | POA: Diagnosis not present

## 2012-02-07 ENCOUNTER — Other Ambulatory Visit: Payer: Self-pay | Admitting: Gastroenterology

## 2012-02-07 DIAGNOSIS — K5900 Constipation, unspecified: Secondary | ICD-10-CM | POA: Diagnosis not present

## 2012-02-07 DIAGNOSIS — R109 Unspecified abdominal pain: Secondary | ICD-10-CM

## 2012-02-09 ENCOUNTER — Other Ambulatory Visit: Payer: Medicare Other

## 2012-02-12 ENCOUNTER — Ambulatory Visit
Admission: RE | Admit: 2012-02-12 | Discharge: 2012-02-12 | Disposition: A | Discharge status: Home / Self Care | Payer: Medicare Other | Source: Ambulatory Visit | Attending: Gastroenterology | Admitting: Gastroenterology

## 2012-02-12 DIAGNOSIS — N200 Calculus of kidney: Secondary | ICD-10-CM | POA: Diagnosis not present

## 2012-02-12 DIAGNOSIS — R1032 Left lower quadrant pain: Secondary | ICD-10-CM | POA: Diagnosis not present

## 2012-02-12 DIAGNOSIS — N289 Disorder of kidney and ureter, unspecified: Secondary | ICD-10-CM | POA: Diagnosis not present

## 2012-02-12 DIAGNOSIS — R109 Unspecified abdominal pain: Secondary | ICD-10-CM

## 2012-02-12 DIAGNOSIS — K5900 Constipation, unspecified: Secondary | ICD-10-CM

## 2012-02-21 DIAGNOSIS — M4712 Other spondylosis with myelopathy, cervical region: Secondary | ICD-10-CM | POA: Diagnosis not present

## 2012-03-13 DIAGNOSIS — R079 Chest pain, unspecified: Secondary | ICD-10-CM | POA: Diagnosis not present

## 2012-03-13 DIAGNOSIS — Z01810 Encounter for preprocedural cardiovascular examination: Secondary | ICD-10-CM | POA: Diagnosis not present

## 2012-03-13 DIAGNOSIS — R0602 Shortness of breath: Secondary | ICD-10-CM | POA: Diagnosis not present

## 2012-03-13 DIAGNOSIS — I10 Essential (primary) hypertension: Secondary | ICD-10-CM | POA: Diagnosis not present

## 2012-03-19 DIAGNOSIS — I2699 Other pulmonary embolism without acute cor pulmonale: Secondary | ICD-10-CM | POA: Diagnosis not present

## 2012-03-19 DIAGNOSIS — Z7901 Long term (current) use of anticoagulants: Secondary | ICD-10-CM | POA: Diagnosis not present

## 2012-03-21 ENCOUNTER — Other Ambulatory Visit: Payer: Self-pay | Admitting: Neurological Surgery

## 2012-04-23 ENCOUNTER — Encounter (HOSPITAL_COMMUNITY): Payer: Self-pay | Admitting: Pharmacy Technician

## 2012-04-24 DIAGNOSIS — M4712 Other spondylosis with myelopathy, cervical region: Secondary | ICD-10-CM | POA: Diagnosis not present

## 2012-04-24 DIAGNOSIS — E1165 Type 2 diabetes mellitus with hyperglycemia: Secondary | ICD-10-CM | POA: Diagnosis not present

## 2012-04-26 DIAGNOSIS — I2699 Other pulmonary embolism without acute cor pulmonale: Secondary | ICD-10-CM | POA: Diagnosis not present

## 2012-04-26 DIAGNOSIS — Z7901 Long term (current) use of anticoagulants: Secondary | ICD-10-CM | POA: Diagnosis not present

## 2012-05-01 ENCOUNTER — Encounter (HOSPITAL_COMMUNITY)
Admission: RE | Admit: 2012-05-01 | Discharge: 2012-05-01 | Disposition: A | Discharge status: Home / Self Care | Payer: Medicare Other | Source: Ambulatory Visit | Attending: Neurological Surgery | Admitting: Neurological Surgery

## 2012-05-01 ENCOUNTER — Encounter (HOSPITAL_COMMUNITY): Payer: Self-pay

## 2012-05-01 DIAGNOSIS — Z01811 Encounter for preprocedural respiratory examination: Secondary | ICD-10-CM | POA: Diagnosis not present

## 2012-05-01 HISTORY — DX: Myoneural disorder, unspecified: G70.9

## 2012-05-01 HISTORY — DX: Essential (primary) hypertension: I10

## 2012-05-01 HISTORY — DX: Other complications of anesthesia, initial encounter: T88.59XA

## 2012-05-01 HISTORY — DX: Adverse effect of unspecified anesthetic, initial encounter: T41.45XA

## 2012-05-01 HISTORY — DX: Peripheral vascular disease, unspecified: I73.9

## 2012-05-01 HISTORY — DX: Other pulmonary embolism without acute cor pulmonale: I26.99

## 2012-05-01 HISTORY — DX: Sleep apnea, unspecified: G47.30

## 2012-05-01 HISTORY — DX: Unspecified osteoarthritis, unspecified site: M19.90

## 2012-05-01 HISTORY — DX: Effusion, left ankle: M25.472

## 2012-05-01 HISTORY — DX: Dizziness and giddiness: R42

## 2012-05-01 HISTORY — DX: Chronic kidney disease, unspecified: N18.9

## 2012-05-01 HISTORY — DX: Effusion, left ankle: M25.471

## 2012-05-01 LAB — APTT: aPTT: 34 seconds (ref 24–37)

## 2012-05-01 LAB — BASIC METABOLIC PANEL
CO2: 27 mEq/L (ref 19–32)
Calcium: 9.1 mg/dL (ref 8.4–10.5)
Chloride: 107 mEq/L (ref 96–112)
Potassium: 4.8 mEq/L (ref 3.5–5.1)
Sodium: 142 mEq/L (ref 135–145)

## 2012-05-01 LAB — CBC
Hemoglobin: 12.8 g/dL — ABNORMAL LOW (ref 13.0–17.0)
MCV: 87.1 fL (ref 78.0–100.0)
Platelets: 136 10*3/uL — ABNORMAL LOW (ref 150–400)
RBC: 4.43 MIL/uL (ref 4.22–5.81)
WBC: 5.7 10*3/uL (ref 4.0–10.5)

## 2012-05-01 LAB — SURGICAL PCR SCREEN
MRSA, PCR: NEGATIVE
Staphylococcus aureus: NEGATIVE

## 2012-05-01 NOTE — Progress Notes (Signed)
NOTIFIED ALLISON ZELENAK PA, OF PATIENT OF COMPLICATIONS S/P HIP REPLACEMENT 2011 AT [facility]. PATIENT HAD RENAL COMPLICATIONS AS WELL AS BOWELS "QUIT" WORKING.  ALLISON HAS REVIEWED CHART AND WILL F/U ON PREOP LABS. WE WILL REQUEST MOST RECENT LABS, LAST OFFICE NOTE FROM DR GOLDSBOROUGH([facility]).   ALSO WILL REQUEST SLEEP STUDY FROM [facility].  PATIENT ENCOURAGED TO BRING OWN MASK.

## 2012-05-01 NOTE — Pre-Procedure Instructions (Signed)
20 ABIEL ODEH  05/01/2012   Your procedure is scheduled on:   Tuesday  05/07/12  Report to [place] at 530 AM.  Call this number if you have problems the morning of surgery: [phone]   Remember:   Do not eat food OR DRINK :After Midnight    Take these medicines the morning of surgery with A SIP OF WATER: TYLENOL OR ULTRAM  IF NEEDED, NORVASC(AMLODIPINE), LOPRESSOR(METOPROLOL) (STOP ASPIRIN, COUMADIN, PLAVIX, EFFIENT, HERBAL MEDICINES)    Do not wear jewelry, make-up or nail polish.  Do not wear lotions, powders, or perfumes. You may wear deodorant.  Do not shave 48 hours prior to surgery. Men may shave face and neck.  Do not bring valuables to the hospital.  Contacts, dentures or bridgework may not be worn into surgery.  Leave suitcase in the car. After surgery it may be brought to your room.  For patients admitted to the hospital, checkout time is 11:00 AM the day of discharge.   Patients discharged the day of surgery will not be allowed to drive home.  Name and phone number of your driver:  Special Instructions: SHOWER  2 NIGHTS BEFORE SURGERY WITH CHG   Please read over the following fact sheets that you were given: Pain Booklet, Coughing and Deep Breathing, MRSA Information and Surgical Site Infection Prevention

## 2012-05-02 NOTE — Consult Note (Signed)
Anesthesia Chart Review:  Patient is a 78 year old male scheduled for C4-5, C5-6, C6-7 ACDF on 05/07/12 by Dr. Ellene Route.  History includes former smoker, obesity, HTN, CKD Stage III followed by Dr. Moshe Cipro, PAD, PE '70s and '99, OSA with CPAP use, DM2.  He developed a post-operative ileus requiring colonoscopy for decompression and acute on chronic renal failure (not requiring hemodialysis) following his THA in 10/2009.  His Cardiologist is Dr. Gwenlyn Found [facility]).  He felt patient was at acceptable risk for this procedure.  Lovenox bridging was recommended while patient is off his Coumadin.  EKG on 01/19/12 showed SR with right BBB.    Nuclear stress test on 03/13/12 showed normal myocardial perfusion, EF 67%.  No significant wall motion abnormalities.  Low risk scan.  Echo on 03/21/10 showed: - Left ventricle: The cavity size was normal. There was moderate concentric hypertrophy. Systolic function was normal. The estimated ejection fraction was in the range of 55% to 65%. Wall motion was normal; there were no regional wall motion abnormalities. Doppler parameters are consistent with abnormal left ventricular relaxation (grade 1 diastolic dysfunction). - Aortic valve: Mildly calcified leaflets. Trivial regurgitation. - Mitral valve: Calcified annulus. - Left atrium: The atrium was mildly dilated.  CXR on 05/01/12 showed no active disease. Stable bilateral basilar atelectasis or scarring.   Labs noted.  BUN/Cr 23/2.00, glucose 175.  H/H 12.8/38.6.  PLT 136 (up from 02/2011).  PT/INR 28.8/.2.90.  PTT 34.  He will need a repeat PT/PTT on the day of surgery.  His Cr was 1.95 on 03/06/11, so overall renal function appears stable.  I think it can be followed closely post operatively by Dr. Ellene Route.  (Notes from Dr. Moshe Cipro are under the Health Assessment tab of patient's physical chart.)  Myra Gianotti, PA-C

## 2012-05-06 MED ORDER — DEXTROSE 5 % IV SOLN
3.0000 g | INTRAVENOUS | Status: AC
  Administered 2012-05-07: 3 g via INTRAVENOUS
  Filled 2012-05-06: qty 3000

## 2012-05-06 NOTE — Progress Notes (Signed)
Spoke with Elta Guadeloupe at [facility] sleep on [facility]. He states study had not come over from "[place] He would check into it and call me back.

## 2012-05-07 ENCOUNTER — Encounter (HOSPITAL_COMMUNITY)
Admission: RE | Disposition: A | Discharge status: Home / Self Care | Payer: Self-pay | Source: Ambulatory Visit | Attending: Neurological Surgery

## 2012-05-07 ENCOUNTER — Inpatient Hospital Stay (HOSPITAL_COMMUNITY): Payer: Medicare Other | Admitting: Vascular Surgery

## 2012-05-07 ENCOUNTER — Encounter (HOSPITAL_COMMUNITY): Payer: Self-pay | Admitting: Vascular Surgery

## 2012-05-07 ENCOUNTER — Inpatient Hospital Stay (HOSPITAL_COMMUNITY)
Admission: RE | Admit: 2012-05-07 | Discharge: 2012-05-09 | DRG: 473 | Disposition: A | Discharge status: Home / Self Care | Payer: Medicare Other | Source: Ambulatory Visit | Attending: Neurological Surgery | Admitting: Neurological Surgery

## 2012-05-07 ENCOUNTER — Encounter (HOSPITAL_COMMUNITY): Payer: Self-pay | Admitting: *Deleted

## 2012-05-07 ENCOUNTER — Inpatient Hospital Stay (HOSPITAL_COMMUNITY): Payer: Medicare Other

## 2012-05-07 DIAGNOSIS — I10 Essential (primary) hypertension: Secondary | ICD-10-CM | POA: Diagnosis not present

## 2012-05-07 DIAGNOSIS — Z86711 Personal history of pulmonary embolism: Secondary | ICD-10-CM

## 2012-05-07 DIAGNOSIS — M542 Cervicalgia: Secondary | ICD-10-CM | POA: Diagnosis not present

## 2012-05-07 DIAGNOSIS — Z01812 Encounter for preprocedural laboratory examination: Secondary | ICD-10-CM | POA: Diagnosis not present

## 2012-05-07 DIAGNOSIS — E119 Type 2 diabetes mellitus without complications: Secondary | ICD-10-CM | POA: Diagnosis present

## 2012-05-07 DIAGNOSIS — N189 Chronic kidney disease, unspecified: Secondary | ICD-10-CM | POA: Diagnosis not present

## 2012-05-07 DIAGNOSIS — M4712 Other spondylosis with myelopathy, cervical region: Secondary | ICD-10-CM | POA: Diagnosis not present

## 2012-05-07 DIAGNOSIS — M5000 Cervical disc disorder with myelopathy, unspecified cervical region: Secondary | ICD-10-CM

## 2012-05-07 HISTORY — PX: ANTERIOR CERVICAL DECOMP/DISCECTOMY FUSION: SHX1161

## 2012-05-07 LAB — PROTIME-INR
INR: 1.17 (ref 0.00–1.49)
Prothrombin Time: 14.7 seconds (ref 11.6–15.2)

## 2012-05-07 LAB — APTT: aPTT: 30 seconds (ref 24–37)

## 2012-05-07 LAB — GLUCOSE, CAPILLARY: Glucose-Capillary: 230 mg/dL — ABNORMAL HIGH (ref 70–99)

## 2012-05-07 SURGERY — ANTERIOR CERVICAL DECOMPRESSION/DISCECTOMY FUSION 3 LEVELS
Anesthesia: General | Site: Neck | Wound class: Clean

## 2012-05-07 MED ORDER — DIAZEPAM 5 MG PO TABS
5.0000 mg | ORAL_TABLET | Freq: Four times a day (QID) | ORAL | Status: DC | PRN
  Administered 2012-05-07 – 2012-05-08 (×3): 5 mg via ORAL
  Filled 2012-05-07 (×3): qty 1

## 2012-05-07 MED ORDER — GLIMEPIRIDE 4 MG PO TABS
6.0000 mg | ORAL_TABLET | Freq: Every day | ORAL | Status: DC
  Administered 2012-05-08 – 2012-05-09 (×2): 6 mg via ORAL
  Filled 2012-05-07 (×3): qty 1

## 2012-05-07 MED ORDER — INSULIN GLARGINE 100 UNIT/ML ~~LOC~~ SOLN
35.0000 [IU] | Freq: Every day | SUBCUTANEOUS | Status: DC
  Administered 2012-05-07: 30 [IU] via SUBCUTANEOUS
  Administered 2012-05-08: 35 [IU] via SUBCUTANEOUS

## 2012-05-07 MED ORDER — DEXTROSE 5 % IV SOLN
INTRAVENOUS | Status: DC | PRN
  Administered 2012-05-07 (×2): via INTRAVENOUS

## 2012-05-07 MED ORDER — SODIUM CHLORIDE 0.9 % IJ SOLN: 3.0000 mL | INTRAMUSCULAR | Status: DC | PRN

## 2012-05-07 MED ORDER — LIDOCAINE HCL (CARDIAC) 20 MG/ML IV SOLN
INTRAVENOUS | Status: DC | PRN
  Administered 2012-05-07: 100 mg via INTRAVENOUS

## 2012-05-07 MED ORDER — PROPOFOL 10 MG/ML IV BOLUS
INTRAVENOUS | Status: DC | PRN
  Administered 2012-05-07: 150 mg via INTRAVENOUS

## 2012-05-07 MED ORDER — BACITRACIN 50000 UNITS IM SOLR
INTRAMUSCULAR | Status: AC
  Filled 2012-05-07: qty 1

## 2012-05-07 MED ORDER — THROMBIN 20000 UNITS EX KIT
PACK | CUTANEOUS | Status: DC | PRN
  Administered 2012-05-07: 20000 [IU] via TOPICAL

## 2012-05-07 MED ORDER — ALUM & MAG HYDROXIDE-SIMETH 200-200-20 MG/5ML PO SUSP: 30.0000 mL | Freq: Four times a day (QID) | ORAL | Status: DC | PRN

## 2012-05-07 MED ORDER — ACETAMINOPHEN 10 MG/ML IV SOLN
INTRAVENOUS | Status: AC
  Administered 2012-05-07: 1000 mg via INTRAVENOUS
  Filled 2012-05-07: qty 100

## 2012-05-07 MED ORDER — LACTATED RINGERS IV SOLN
INTRAVENOUS | Status: DC | PRN
  Administered 2012-05-07 (×3): via INTRAVENOUS

## 2012-05-07 MED ORDER — LIDOCAINE-EPINEPHRINE 1 %-1:100000 IJ SOLN
INTRAMUSCULAR | Status: DC | PRN
  Administered 2012-05-07: 20 mL

## 2012-05-07 MED ORDER — FLUOCINOLONE ACETONIDE 0.01 % EX OIL: 1.0000 "application " | TOPICAL_OIL | Freq: Every day | CUTANEOUS | Status: DC

## 2012-05-07 MED ORDER — TRAMADOL HCL 50 MG PO TABS
50.0000 mg | ORAL_TABLET | Freq: Four times a day (QID) | ORAL | Status: DC | PRN
  Filled 2012-05-07: qty 1

## 2012-05-07 MED ORDER — HEMOSTATIC AGENTS (NO CHARGE) OPTIME
TOPICAL | Status: DC | PRN
  Administered 2012-05-07: 1 via TOPICAL

## 2012-05-07 MED ORDER — CEFAZOLIN SODIUM 1-5 GM-% IV SOLN
1.0000 g | Freq: Three times a day (TID) | INTRAVENOUS | Status: AC
  Administered 2012-05-07 (×2): 1 g via INTRAVENOUS
  Filled 2012-05-07 (×2): qty 50

## 2012-05-07 MED ORDER — DEXAMETHASONE SODIUM PHOSPHATE 10 MG/ML IJ SOLN
INTRAMUSCULAR | Status: DC | PRN
  Administered 2012-05-07: 10 mg via INTRAVENOUS

## 2012-05-07 MED ORDER — METOPROLOL TARTRATE 50 MG PO TABS
50.0000 mg | ORAL_TABLET | Freq: Two times a day (BID) | ORAL | Status: DC
  Administered 2012-05-07 – 2012-05-09 (×5): 50 mg via ORAL
  Filled 2012-05-07 (×8): qty 1

## 2012-05-07 MED ORDER — ACETAMINOPHEN 325 MG PO TABS: 650.0000 mg | ORAL_TABLET | ORAL | Status: DC | PRN

## 2012-05-07 MED ORDER — SODIUM CHLORIDE 0.9 % IV SOLN
INTRAVENOUS | Status: AC
  Filled 2012-05-07: qty 500

## 2012-05-07 MED ORDER — SODIUM CHLORIDE 0.9 % IR SOLN
Status: DC | PRN
  Administered 2012-05-07: 08:00:00

## 2012-05-07 MED ORDER — ACETAMINOPHEN 650 MG RE SUPP: 650.0000 mg | RECTAL | Status: DC | PRN

## 2012-05-07 MED ORDER — PHENOL 1.4 % MT LIQD: 1.0000 | OROMUCOSAL | Status: DC | PRN

## 2012-05-07 MED ORDER — TAMSULOSIN HCL 0.4 MG PO CAPS
0.4000 mg | ORAL_CAPSULE | Freq: Every day | ORAL | Status: DC
  Administered 2012-05-07 – 2012-05-09 (×3): 0.4 mg via ORAL
  Filled 2012-05-07 (×3): qty 1

## 2012-05-07 MED ORDER — FENTANYL CITRATE 0.05 MG/ML IJ SOLN
INTRAMUSCULAR | Status: DC | PRN
  Administered 2012-05-07: 50 ug via INTRAVENOUS
  Administered 2012-05-07: 100 ug via INTRAVENOUS
  Administered 2012-05-07 (×2): 50 ug via INTRAVENOUS

## 2012-05-07 MED ORDER — ONDANSETRON HCL 4 MG/2ML IJ SOLN
INTRAMUSCULAR | Status: DC | PRN
  Administered 2012-05-07: 4 mg via INTRAVENOUS

## 2012-05-07 MED ORDER — MIDAZOLAM HCL 5 MG/5ML IJ SOLN
INTRAMUSCULAR | Status: DC | PRN
  Administered 2012-05-07: 2 mg via INTRAVENOUS

## 2012-05-07 MED ORDER — MENTHOL 3 MG MT LOZG: 1.0000 | LOZENGE | OROMUCOSAL | Status: DC | PRN

## 2012-05-07 MED ORDER — AMLODIPINE BESYLATE 10 MG PO TABS
10.0000 mg | ORAL_TABLET | Freq: Every day | ORAL | Status: DC
  Administered 2012-05-07 – 2012-05-09 (×3): 10 mg via ORAL
  Filled 2012-05-07 (×3): qty 1

## 2012-05-07 MED ORDER — HYDROMORPHONE HCL PF 1 MG/ML IJ SOLN
INTRAMUSCULAR | Status: AC
  Filled 2012-05-07: qty 1

## 2012-05-07 MED ORDER — 0.9 % SODIUM CHLORIDE (POUR BTL) OPTIME
TOPICAL | Status: DC | PRN
  Administered 2012-05-07: 1000 mL

## 2012-05-07 MED ORDER — GLYCOPYRROLATE 0.2 MG/ML IJ SOLN
INTRAMUSCULAR | Status: DC | PRN
  Administered 2012-05-07: 0.4 mg via INTRAVENOUS

## 2012-05-07 MED ORDER — HYDROMORPHONE HCL PF 1 MG/ML IJ SOLN
0.2500 mg | INTRAMUSCULAR | Status: DC | PRN
  Administered 2012-05-07 (×3): 0.5 mg via INTRAVENOUS

## 2012-05-07 MED ORDER — SODIUM CHLORIDE 0.9 % IV SOLN
INTRAVENOUS | Status: DC | PRN
  Administered 2012-05-07: 08:00:00 via INTRAVENOUS

## 2012-05-07 MED ORDER — MORPHINE SULFATE 2 MG/ML IJ SOLN: 1.0000 mg | INTRAMUSCULAR | Status: DC | PRN

## 2012-05-07 MED ORDER — ONDANSETRON HCL 4 MG/2ML IJ SOLN: 4.0000 mg | INTRAMUSCULAR | Status: DC | PRN

## 2012-05-07 MED ORDER — ROCURONIUM BROMIDE 100 MG/10ML IV SOLN
INTRAVENOUS | Status: DC | PRN
  Administered 2012-05-07: 10 mg via INTRAVENOUS
  Administered 2012-05-07: 20 mg via INTRAVENOUS
  Administered 2012-05-07: 40 mg via INTRAVENOUS

## 2012-05-07 MED ORDER — SODIUM CHLORIDE 0.9 % IJ SOLN
3.0000 mL | Freq: Two times a day (BID) | INTRAMUSCULAR | Status: DC
  Administered 2012-05-07 – 2012-05-09 (×4): 3 mL via INTRAVENOUS

## 2012-05-07 MED ORDER — BENAZEPRIL HCL 40 MG PO TABS
40.0000 mg | ORAL_TABLET | Freq: Every day | ORAL | Status: DC
  Administered 2012-05-07 – 2012-05-09 (×3): 40 mg via ORAL
  Filled 2012-05-07 (×3): qty 1

## 2012-05-07 MED ORDER — BUPIVACAINE HCL (PF) 0.25 % IJ SOLN
INTRAMUSCULAR | Status: DC | PRN
  Administered 2012-05-07: 30 mL

## 2012-05-07 MED ORDER — OXYCODONE-ACETAMINOPHEN 5-325 MG PO TABS
1.0000 | ORAL_TABLET | ORAL | Status: DC | PRN
  Administered 2012-05-07: 1 via ORAL
  Administered 2012-05-07: 2 via ORAL
  Administered 2012-05-08 – 2012-05-09 (×4): 1 via ORAL
  Filled 2012-05-07 (×2): qty 2
  Filled 2012-05-07 (×5): qty 1

## 2012-05-07 MED ORDER — SODIUM CHLORIDE 0.9 % IV SOLN: 250.0000 mL | INTRAVENOUS | Status: DC

## 2012-05-07 MED ORDER — ONDANSETRON HCL 4 MG/2ML IJ SOLN: 4.0000 mg | Freq: Four times a day (QID) | INTRAMUSCULAR | Status: DC | PRN

## 2012-05-07 MED ORDER — PHENYLEPHRINE HCL 10 MG/ML IJ SOLN
10.0000 mg | INTRAVENOUS | Status: DC | PRN
  Administered 2012-05-07: 25 ug/min via INTRAVENOUS

## 2012-05-07 MED ORDER — NEOSTIGMINE METHYLSULFATE 1 MG/ML IJ SOLN
INTRAMUSCULAR | Status: DC | PRN
  Administered 2012-05-07: 3 mg via INTRAVENOUS

## 2012-05-07 SURGICAL SUPPLY — 68 items
ADH SKN CLS APL DERMABOND .7 (GAUZE/BANDAGES/DRESSINGS) ×1
BAG DECANTER FOR FLEXI CONT (MISCELLANEOUS) ×2 IMPLANT
BANDAGE GAUZE ELAST BULKY 4 IN (GAUZE/BANDAGES/DRESSINGS) ×1 IMPLANT
BIT DRILL 14MM (INSTRUMENTS) IMPLANT
BIT DRILL NEURO 2X3.1 SFT TUCH (MISCELLANEOUS) ×1 IMPLANT
BUR BARREL STRAIGHT FLUTE 4.0 (BURR) ×2 IMPLANT
CAGE CERVICAL 7MM (Cage) ×2 IMPLANT
CANISTER SUCTION 2500CC (MISCELLANEOUS) ×2 IMPLANT
CLOTH BEACON ORANGE TIMEOUT ST (SAFETY) ×2 IMPLANT
CONT SPEC 4OZ CLIKSEAL STRL BL (MISCELLANEOUS) ×2 IMPLANT
DECANTER SPIKE VIAL GLASS SM (MISCELLANEOUS) ×2 IMPLANT
DERMABOND ADVANCED (GAUZE/BANDAGES/DRESSINGS) ×1
DERMABOND ADVANCED .7 DNX12 (GAUZE/BANDAGES/DRESSINGS) ×1 IMPLANT
DRAPE LAPAROTOMY 100X72 PEDS (DRAPES) ×2 IMPLANT
DRAPE MICROSCOPE LEICA (MISCELLANEOUS) IMPLANT
DRAPE POUCH INSTRU U-SHP 10X18 (DRAPES) ×2 IMPLANT
DRESSING TELFA 8X3 (GAUZE/BANDAGES/DRESSINGS) ×2 IMPLANT
DRILL 14MM (INSTRUMENTS) ×2
DRILL NEURO 2X3.1 SOFT TOUCH (MISCELLANEOUS) ×2
DRSG OPSITE 4X5.5 SM (GAUZE/BANDAGES/DRESSINGS) ×2 IMPLANT
DURAPREP 6ML APPLICATOR 50/CS (WOUND CARE) ×2 IMPLANT
ELECT REM PT RETURN 9FT ADLT (ELECTROSURGICAL) ×2
ELECTRODE REM PT RTRN 9FT ADLT (ELECTROSURGICAL) ×1 IMPLANT
GAUZE SPONGE 2X2 8PLY STRL LF (GAUZE/BANDAGES/DRESSINGS) IMPLANT
GAUZE SPONGE 4X4 16PLY XRAY LF (GAUZE/BANDAGES/DRESSINGS) IMPLANT
GLOVE BIO SURGEON STRL SZ7.5 (GLOVE) ×2 IMPLANT
GLOVE BIOGEL PI IND STRL 6.5 (GLOVE) IMPLANT
GLOVE BIOGEL PI IND STRL 7.5 (GLOVE) IMPLANT
GLOVE BIOGEL PI IND STRL 8 (GLOVE) IMPLANT
GLOVE BIOGEL PI IND STRL 8.5 (GLOVE) ×1 IMPLANT
GLOVE BIOGEL PI INDICATOR 6.5 (GLOVE) ×1
GLOVE BIOGEL PI INDICATOR 7.5 (GLOVE)
GLOVE BIOGEL PI INDICATOR 8 (GLOVE) ×3
GLOVE BIOGEL PI INDICATOR 8.5 (GLOVE) ×1
GLOVE ECLIPSE 8.5 STRL (GLOVE) ×2 IMPLANT
GLOVE EXAM NITRILE LRG STRL (GLOVE) ×1 IMPLANT
GLOVE EXAM NITRILE MD LF STRL (GLOVE) ×2 IMPLANT
GLOVE EXAM NITRILE XL STR (GLOVE) ×1 IMPLANT
GLOVE EXAM NITRILE XS STR PU (GLOVE) IMPLANT
GLOVE INDICATOR 7.0 STRL GRN (GLOVE) ×1 IMPLANT
GLOVE INDICATOR 8.5 STRL (GLOVE) ×2 IMPLANT
GOWN BRE IMP SLV AUR LG STRL (GOWN DISPOSABLE) IMPLANT
GOWN BRE IMP SLV AUR XL STRL (GOWN DISPOSABLE) ×2 IMPLANT
GOWN STRL REIN 2XL LVL4 (GOWN DISPOSABLE) ×4 IMPLANT
HEAD HALTER (SOFTGOODS) ×2 IMPLANT
KIT BASIN OR (CUSTOM PROCEDURE TRAY) ×2 IMPLANT
KIT ROOM TURNOVER OR (KITS) ×2 IMPLANT
NDL SPNL 22GX3.5 QUINCKE BK (NEEDLE) ×1 IMPLANT
NEEDLE HYPO 22GX1.5 SAFETY (NEEDLE) ×2 IMPLANT
NEEDLE SPNL 22GX3.5 QUINCKE BK (NEEDLE) ×2 IMPLANT
NS IRRIG 1000ML POUR BTL (IV SOLUTION) ×2 IMPLANT
PACK LAMINECTOMY NEURO (CUSTOM PROCEDURE TRAY) ×2 IMPLANT
PAD ARMBOARD 7.5X6 YLW CONV (MISCELLANEOUS) ×6 IMPLANT
PLATE 54MM (Plate) ×1 IMPLANT
PUTTY DBM 5CC ×1 IMPLANT
RUBBERBAND STERILE (MISCELLANEOUS) IMPLANT
SCREW 14MM (Screw) ×8 IMPLANT
SPACER PAR CORT CERV S (Spacer) ×1 IMPLANT
SPONGE GAUZE 2X2 STER 10/PKG (GAUZE/BANDAGES/DRESSINGS) ×1
SPONGE INTESTINAL PEANUT (DISPOSABLE) ×2 IMPLANT
SPONGE SURGIFOAM ABS GEL 100 (HEMOSTASIS) ×2 IMPLANT
SUT VIC AB 3-0 SH 8-18 (SUTURE) ×4 IMPLANT
SYR 20ML ECCENTRIC (SYRINGE) ×2 IMPLANT
TAPE CLOTH SURG 4X10 WHT LF (GAUZE/BANDAGES/DRESSINGS) ×1 IMPLANT
TOWEL OR 17X24 6PK STRL BLUE (TOWEL DISPOSABLE) ×2 IMPLANT
TOWEL OR 17X26 10 PK STRL BLUE (TOWEL DISPOSABLE) ×2 IMPLANT
TRAY FOLEY CATH 14FRSI W/METER (CATHETERS) ×1 IMPLANT
WATER STERILE IRR 1000ML POUR (IV SOLUTION) ×2 IMPLANT

## 2012-05-07 NOTE — Progress Notes (Signed)
Utilization review completed.  

## 2012-05-07 NOTE — Progress Notes (Signed)
NOTIFIED DR MASSAGEE OF PATIENT EATING 1  WORTHER'S ORIG. HARD CANDY FOR BLOOD SUGAR 65 THIS AM 0515 AM.  DR MASSAGEE STATED OKAY.

## 2012-05-07 NOTE — Anesthesia Preprocedure Evaluation (Addendum)
Anesthesia Evaluation  Patient identified by MRN, date of birth, ID band Patient awake    Reviewed: Allergy & Precautions, H&P , NPO status , Patient's Chart, lab work & pertinent test results  Airway Mallampati: II TM Distance: >3 FB Neck ROM: full    Dental  (+) Teeth Intact, Dental Advisory Given and Caps   Pulmonary sleep apnea and Continuous Positive Airway Pressure Ventilation ,      *RADIOLOGY REPORT*   Clinical Data: Preop for anterior cervical decompression   CHEST - 2 VIEW   Comparison: 03/19/2010   Wears C-Pap @ HS  Findings: Cardiomediastinal silhouette is stable.  No acute infiltrate or pleural effusion.  Mild degenerative changes thoracic spine.  Stable bilateral basilar atelectasis or scarring.   IMPRESSION: No active disease.  Stable bilateral basilar atelectasis or scarring.        Pulmonary exam normal       Cardiovascular Exercise Tolerance: Poor hypertension, Pt. on home beta blockers and Pt. on medications Rhythm:Regular Rate:Normal  Hx of DVT's   Neuro/Psych  Neuromuscular disease    GI/Hepatic   Endo/Other  diabetes, Type 2, Insulin Dependentobese  Renal/GU Renal InsufficiencyRenal disease     Musculoskeletal  (+) Arthritis -, Osteoarthritis,    Abdominal   Peds  Hematology negative hematology ROS (+)   Anesthesia Other Findings Crowns in lateral uppers  Reproductive/Obstetrics                         Anesthesia Physical Anesthesia Plan  ASA: III  Anesthesia Plan: General   Post-op Pain Management:    Induction: Intravenous  Airway Management Planned: Oral ETT  Additional Equipment:   Intra-op Plan:   Post-operative Plan: Extubation in OR  Informed Consent: I have reviewed the patients History and Physical, chart, labs and discussed the procedure including the risks, benefits and alternatives for the proposed anesthesia with the patient or  authorized representative who has indicated his/her understanding and acceptance.   Dental advisory given  Plan Discussed with: CRNA and Surgeon  Anesthesia Plan Comments:        Anesthesia Quick Evaluation

## 2012-05-07 NOTE — Anesthesia Postprocedure Evaluation (Signed)
  Anesthesia Post-op Note  Patient: Chase [name]  Procedure(s) Performed: Procedure(s) (LRB) with comments: ANTERIOR CERVICAL DECOMPRESSION/DISCECTOMY FUSION 3 LEVELS (N/A) - Cervical four - five, five- six, six- seven  Anterior cervical decompression/diskectomy/fusion  Patient Location: PACU  Anesthesia Type: General  Level of Consciousness: awake  Airway and Oxygen Therapy: Patient Spontanous Breathing  Post-op Pain: mild  Post-op Assessment: Post-op Vital signs reviewed  Post-op Vital Signs: Reviewed  Complications: No apparent anesthesia complications

## 2012-05-07 NOTE — Anesthesia Procedure Notes (Signed)
Procedure Name: Intubation Date/Time: 05/07/2012 8:12 AM Performed by: Wanita Chamberlain Pre-anesthesia Checklist: Patient being monitored, Suction available, Emergency Drugs available, Patient identified and Timeout performed Patient Re-evaluated:Patient Re-evaluated prior to inductionOxygen Delivery Method: Circle system utilized Preoxygenation: Pre-oxygenation with 100% oxygen Intubation Type: IV induction Ventilation: Mask ventilation without difficulty and Oral airway inserted - appropriate to patient size Laryngoscope Size: Mac and 3 Grade View: Grade II Tube type: Oral Tube size: 8.5 mm Number of attempts: 1 Airway Equipment and Method: Stylet Placement Confirmation: positive ETCO2,  ETT inserted through vocal cords under direct vision and breath sounds checked- equal and bilateral Secured at: 22 cm Tube secured with: Tape Dental Injury: Teeth and Oropharynx as per pre-operative assessment

## 2012-05-07 NOTE — Preoperative (Signed)
Beta Blockers   Took Lopressor 50 mg @ 2200  05/06/12

## 2012-05-07 NOTE — H&P (Signed)
05/07/2012 Chase [name] presents today for surgical decompression of C4-5 C5-6 and C6-C7 he has significant spondylosis at these levels with cord compression and right-sided weakness. His exam has not changed since his initial evaluation 02/21/2012 and other factors in his past medical history medications surgical history and review of systems are unchanged  05/02/12 Chase [name] returns to the office for preoperative questioning and answering regarding an upcoming anterior decompression and arthrodesis at C4-5, 5-6, C6-7.  He had some specific questions regarding length of stay, nature of the surgery, the approach and I answered these to the best possible way I could.  We will plan the surgery for later on this month.          Earleen Newport, M.D./sv NEUROSURGICAL CONSULTATION  Suzan Nailer  [id number]  DOB:  [birth year]  February 21, 2012  CHIEF COMPLAINT:    Weakness in the arms on the right side with pain in the neck, arms, back and legs.    HISTORY OF PRESENT ILLNESS:  Chase [name] is a 78 year old left-handed individual who tells me that he has been having problems with weakness in his upper extremities for over a year's period of time.  He notes in the past year he has been much more acutely aware and his left arm has been fairly strong although not as strong as it had been but his right arm has gotten very weak.  He says he has pain in his neck.  He also complains of pain in his back and his legs and he has had weakness in that right arm and right leg for some time now.  He finds that he can't grasp things very well particularly with the right upper extremity.  He has not had any problems with bowel or bladder control and he has been seen by Dr. Gaynelle Arabian for workup of his neck and his back and it was noted that he had significant spondylosis in his lumbar spine most notably at L4-5 with some central canal stenosis there.  In the cervical spine, he had advanced cervical spinal stenosis at C3-4, 4-5, 5-6  and 6-7 with cord compression at C4-5, C5-6 and C6-7 that is evident on the sagittal images with myelomalacia at these levels.  At C3-4, he has the mildest of spinal compromise with some evidence of epidural spinal fluid.    PAST MEDICAL HISTORY:   His Past Medical History reveals that his health has had some challenges with diabetes and high blood pressure.  He also tells me he has had pulmonary emboli in the past.  MEDICATIONS:     He is currently on a number of medications including Coumadin 7.5 mg. q.d.  He takes Metoprolol 100 mg. q.d., Benazepril 40 mg. q.d., Amlodipine 10 mg. q.d., Finasteride 5 mg. q.d. for his prostate, Lantus SoloSTAR 35 units for diabetes, Glimepiride 6 mg. q.d. and tramadol 50 mg. as needed for pain.    ALLERGIES:     He notes no allergies to any pain medications or any medications at all.  PAST SURGICAL HISTORY:   Disc surgery in 1982 by Dr. Jenean Lindau, hip surgery in 2011 by Dr. Wynelle Link, and right hand surgery by Dr. Fredna Dow in 2012.    SOCIAL HISTORY:    He does not smoke or use alcohol.  His height and weight have been stable at 5'11" and 250 pounds.    REVIEW OF SYSTEMS:   Notable for wearing of glasses, hearing loss, ringing in the ears, balance disturbance, nasal  congestion and drainage, high blood pressure, swelling in the feet and hands, leg pain while walking, arm weakness, leg weakness, back pain, arm pain, leg pain, joint pain and      swelling, arthritis, neck pain, difficulty with starting and stopping of urinary stream, abdominal pain, difficulty with memory, diabetes, excessive thirst and urination and bleeding tendency secondary to his Coumadin usage.    PHYSICAL EXAMINATION:   On physical examination, I note that he has a range of motion allowing him to turn 45 degrees to the left and the right.  He flexes and extends about 60% of normal.  Axial compression does not reproduce any pain and there is no tenderness in the supraclavicular fossas. He has notable  weakness in the right deltoid, biceps, wrist extensor, grip and intrinsics.  The triceps on the right is also weak at 4/5 compared to the left side.  His gait reveals some mild antalgia involving that right lower extremity also.  Reflexes, however, are absent in both biceps and triceps, absent in the patella on the right, 2+ on the left.  Achilles reflexes are 1+ bilaterally.   DIAGNOSTIC IMAGES:     Review of the patient's MRI scan reveals he has advanced spondylosis and stenosis at C4-5, 5-6 and 6-7 with intrinsic cord changes at each of these levels.   IMPRESSION:      I believe the patient has advanced myelopathy secondary to these findings on the MRI and ultimately I indicated that he should undergo surgical decompression at C4-5, C5-6 and C6-7.  I discussed with him the major risks of surgery including the possibility of neural injury and potential for paralysis but that notwithstanding he is already losing substantial strength and I believe that decompression of the areas involved gives him the best chance of regaining some strength regaining usage of his right upper extremity. I also indicated the major risks of the acute process of the surgery, that is swallowing difficulties, potential for blood clot in the neck, difficulties with neck and shoulder pain that sometimes encounter the surgical process.  All these things notwithstanding, I believe that surgery is an appropriate consideration given his profound cord compression.  I indicated that somewhere down the road we can look at his lumbar spine, discuss and review the degree of stenosis at L4-5 which may benefit from focal decompression.  Nonetheless at the current time, the main overriding problem is the cervical spondylitic myelopathy and the need for surgical decompression there.  We will schedule at the earliest convenience.  The patient did note that he should see Dr. Gwenlyn Found prior to any surgical intervention and I certainly encouraged this.  The  patient did tell me that he does have some renal insufficiency which may also need to be re-evaluated prior to surgical intervention.

## 2012-05-07 NOTE — Progress Notes (Signed)
Orthopedic Tech Progress Note Patient Details:  Chase [name] 1933-05-27 [id number] Neurosurgery called Ortho Tech for soft cervical collar. Pt. In surgery, collar delivered to secretary at nurses station in Neuro.  Ortho Devices Type of Ortho Device: Soft collar Ortho Device/Splint Interventions: Ordered   [place] 05/07/2012, 8:37 AM

## 2012-05-07 NOTE — Op Note (Signed)
Preoperative diagnosis: Cervical spondylosis with radiculopathy and myelopathy C4-5 C5-6 C6-C7 Post operative diagnosis: Cervical spondylosis with radiculopathy and myelopathy C4-5 C5-6 C6-C7 Procedure: Anterior cervical discectomy decompression of nerve roots and spinal canal C4-5 C5-6 C6-C7 arthrodesis with structural allograft, Alphatec plate fixation [id number] Surgeon: Kristeen Miss M.D. Asst.: Hazle Coca M.D. Indications: Patient is a 78 year old individual is had progressive weakness of his right upper extremity an MRI demonstrates that he has evidence of cord compression at C4-5-5 6 and C6-C7 he has intrinsic signal changes at C4-5 he's had right upper extremity weakness his been advised regarding the need for surgical decompression and arthrodesis.  Procedure: The patient was brought to the operating room placed on the table in supine position. After the smooth induction of general endotracheal anesthesia neck was placed in 5 pounds of halter traction and prepped with alcohol and DuraPrep. After sterile draping and appropriate timeout procedure a transverse incision was created in the left side of the neck and carried down to the platysma. The plane between the sternocleidomastoid and strap muscles dissected bluntly until the prevertebral space was reached. The first identifiable disc space was noted to be C4-5 on a localizing radiograph. The dissection was then undertaken in the longus coli muscle to allow placement of a self-retaining Caspar type retractor.  The anterior longitudinal ligament was opened at C4-5 and ventral osteophytes were removed with a Leksell rongeur and Kerrison punch. Interspace was cleared of significant quantity of the degenerated disc material ~region of the posterior longitudinal ligament was reached. Dissection was carried out using a high-speed drill and 3-0 Karlin curettes. Uncinate processes were drilled down and removed and osteophytes from the inferior margin of the  body of C4 were removed with a Kerrison 2 mm gold punch. After the central canal and lateral recesses were well decompressed the stasis was achieved with the bipolar cautery and some small pledgets of Gelfoam soaked in thrombin that were later irrigated away.  A 7 mm transgraft was then prepared by enlarging the central cavity and filling with demineralized bone matrix and placing into the interspace. C5-6 Was decompressed and fused in a similar fashion. C6-7 Was then decompressed and also fused in a similar fashion.  Next the retractor was removed and a 54 mm trestle plate was placed over the vertebral bodies and secured with working millimeter variable angle screws. A final localizing radiograph identified the position of the surgical construct. The stasis was achieved in the soft tissues and then the platysma was closed with 3-0 Vicryl in an interrupted fashion and 3-0 Vicryl was used in the subcuticular tissue. Blood loss was estimated at 250 cc

## 2012-05-07 NOTE — Transfer of Care (Signed)
Immediate Anesthesia Transfer of Care Note  Patient: Chase [name]  Procedure(s) Performed: Procedure(s) (LRB) with comments: ANTERIOR CERVICAL DECOMPRESSION/DISCECTOMY FUSION 3 LEVELS (N/A) - Cervical four - five, five- six, six- seven  Anterior cervical decompression/diskectomy/fusion  Patient Location: PACU  Anesthesia Type: General  Level of Consciousness: awake, alert , oriented and patient cooperative  Airway & Oxygen Therapy: Patient Spontanous Breathing and Patient connected to nasal cannula oxygen  Post-op Assessment: Report given to PACU RN, Post -op Vital signs reviewed and stable and Patient moving all extremities  Post vital signs: Reviewed and stable  Complications: No apparent anesthesia complications

## 2012-05-08 LAB — GLUCOSE, CAPILLARY: Glucose-Capillary: 156 mg/dL — ABNORMAL HIGH (ref 70–99)

## 2012-05-08 MED ORDER — OXYCODONE-ACETAMINOPHEN 5-325 MG PO TABS: 1.0000 | ORAL_TABLET | ORAL | Status: DC | PRN

## 2012-05-08 MED ORDER — DEXAMETHASONE SODIUM PHOSPHATE 10 MG/ML IJ SOLN
10.0000 mg | Freq: Once | INTRAMUSCULAR | Status: AC
  Administered 2012-05-08: 10 mg via INTRAVENOUS

## 2012-05-08 MED ORDER — DIPHENHYDRAMINE HCL 25 MG PO CAPS
50.0000 mg | ORAL_CAPSULE | Freq: Every evening | ORAL | Status: DC | PRN
  Administered 2012-05-08: 50 mg via ORAL
  Filled 2012-05-08: qty 2

## 2012-05-08 MED ORDER — DIAZEPAM 5 MG PO TABS: 5.0000 mg | ORAL_TABLET | Freq: Four times a day (QID) | ORAL | Status: DC | PRN

## 2012-05-08 MED ORDER — DEXAMETHASONE SODIUM PHOSPHATE 10 MG/ML IJ SOLN
INTRAMUSCULAR | Status: AC
  Filled 2012-05-08: qty 1

## 2012-05-08 NOTE — Progress Notes (Signed)
Referral received for SNF. Chart reviewed and CSW has spoken with pt's nurse- Asheley who indicates that patient is for DC to home with Home Health when stable.  RNCM- Rozanna Boer is aware of above.  SNF is not indicated for this patient.  CSW to sign off. Please re-consult if CSW needs arise.  Lorie Phenix. [place], [place]

## 2012-05-08 NOTE — Progress Notes (Signed)
Subjective: Patient reports Right shoulder and arm pain some difficulty with swallowing medicine  Objective: Vital signs in last 24 hours: Temp:  [97.2 F (36.2 C)-99.1 F (37.3 C)] 98.5 F (36.9 C) (09/25 0801) Pulse Rate:  [48-88] 55  (09/25 0801) Resp:  [10-18] 17  (09/25 0801) BP: (108-163)/(46-81) 147/67 mmHg (09/25 0801) SpO2:  [92 %-98 %] 96 % (09/25 0801)  Intake/Output from previous day: 09/24 0701 - 09/25 0700 In: 3350 [P.O.:600; I.V.:2750] Out: 1450 [Urine:1250; Blood:200] Intake/Output this shift:    Right deltoid 3/5 strength posterior deltoids somewhat weaker. Biceps and triceps appear strong incision is clean and dry moderate swelling in throat  Lab Results: No results found for this basename: WBC:2,HGB:2,HCT:2,PLT:2 in the last 72 hours BMET No results found for this basename: NA:2,K:2,CL:2,CO2:2,GLUCOSE:2,BUN:2,CREATININE:2,CALCIUM:2 in the last 72 hours  Studies/Results: Dg Cervical Spine 2-3 Views  05/07/2012  *RADIOLOGY REPORT*  Clinical Data: Neck pain  CERVICAL SPINE - 2-3 VIEW  Comparison: None.  Findings: Film #1 demonstrates a needle at C4-C5.  Film #2 demonstrates C4-C7 ACDF with anterior plating.  Sponge is noted in anterior soft tissue.  Grossly satisfactory position and alignment.  IMPRESSION: As above.   Original Report Authenticated By: Staci Righter, M.D.     Assessment/Plan: Stable postop day 1 we'll observe in the hospital today if stable later on this afternoon may consider discharge this evening  LOS: 1 day  Observe in hospital. Will order outpatient PT after discharge   Joceline Hinchcliff J 05/08/2012, 9:17 AM

## 2012-05-08 NOTE — Evaluation (Signed)
Occupational Therapy Evaluation Patient Details Name: Chase [name] MRN: [id number] DOB: Dec 17, 1932 Today's Date: 05/08/2012 Time: [id number] OT Time Calculation (min): 32 min  OT Assessment / Plan / Recommendation Clinical Impression  Pt admitted for ACD C4-C7 with deficits listed below.  Pt would benefit from cont OT to increase use and ROM of RUE.      OT Assessment  Patient needs continued OT Services    Follow Up Recommendations  Outpatient OT    Barriers to Discharge None    Equipment Recommendations  None recommended by OT    Recommendations for Other Services    Frequency  Min 2X/week    Precautions / Restrictions Precautions Precautions: Cervical Required Braces or Orthoses: Cervical Brace (soft collar for comfort only.) Cervical Brace: Soft collar Restrictions Weight Bearing Restrictions: No   Pertinent Vitals/Pain Pt with 3/10 pain.  MD aware.    ADL  Eating/Feeding: Performed;Independent Where Assessed - Eating/Feeding: Chair Grooming: Performed;Teeth care;Wash/dry face;Wash/dry hands;Supervision/safety Where Assessed - Grooming: Unsupported standing Upper Body Bathing: Simulated;Set up Where Assessed - Upper Body Bathing: Unsupported standing Lower Body Bathing: Simulated;Min guard Where Assessed - Lower Body Bathing: Unsupported sit to stand Upper Body Dressing: Performed;Minimal assistance Where Assessed - Upper Body Dressing: Unsupported sitting Lower Body Dressing: Performed;Minimal assistance Where Assessed - Lower Body Dressing: Unsupported sit to stand Toilet Transfer: Performed;Min guard Toilet Transfer Method: Sit to Loss adjuster, chartered: Comfort height toilet;Grab bars Toileting - Water quality scientist and Hygiene: Simulated;Supervision/safety Where Assessed - Best boy and Hygiene: Standing Equipment Used: Rolling walker Transfers/Ambulation Related to ADLs: Pt walked all around room with rolling walker and  S. ADL Comments: Pt does well with adls.  Limited some with UE dressing secondary to shoulder pain.  Pt can reach down to feet for socks and shoes although it is not comfortable.    OT Diagnosis: Generalized weakness  OT Problem List: Decreased strength;Decreased range of motion;Decreased coordination;Pain OT Treatment Interventions: Therapeutic exercise;Self-care/ADL training   OT Goals Acute Rehab OT Goals OT Goal Formulation: With patient Time For Goal Achievement: 05/17/12 Potential to Achieve Goals: Good ADL Goals Pt Will Perform Upper Body Dressing: with supervision;with cueing (comment type and amount) (min cueing for technique.) ADL Goal: Upper Body Dressing - Progress: Goal set today Arm Goals Additional Arm Goal #1: Pt will complete R shoulder ROM exercies (flexion and abdution) and finger opposition exercises Ily. Arm Goal: Additional Goal #1 - Progress: Goal set today  Visit Information  Last OT Received On: 05/08/12 Assistance Needed: +1    Subjective Data  Subjective: "I might go home today." Patient Stated Goal: to use my R arm better   Prior Functioning  Vision/Perception  Home Living Lives With: Spouse Available Help at Discharge: Available 24 hours/day Type of Home: House Home Access: Stairs to enter [employer] of Steps: 3 Entrance Stairs-Rails: None Home Layout: Two level Alternate Level Stairs-Number of Steps: 11 Alternate Level Stairs-Rails: Left Bathroom Shower/Tub: Walk-in shower;Door [employer] Toilet: Handicapped height Bathroom Accessibility: Yes How Accessible: Other (comment) (uses cane in bathroom.) Home Adaptive Equipment: Built-in shower seat;Walker - rolling;Quad cane Prior Function Level of Independence: Independent with assistive device(s) Able to Take Stairs?: Yes Driving: Yes Vocation: Retired Corporate investment banker: No difficulties Dominant Hand: Left   Vision - Assessment Vision Assessment: Vision not tested    Cognition  Overall Cognitive Status: Appears within functional limits for tasks assessed/performed Arousal/Alertness: Awake/alert Orientation Level: Oriented X4 / Intact Behavior During Session: [employer] for tasks performed Cognition - Other Comments:  Intact.    Extremity/Trunk Assessment Right Upper Extremity Assessment RUE ROM/Strength/Tone: Deficits RUE ROM/Strength/Tone Deficits: RUE painful.  Deltoid 2/5.  Biceps and triceps WFL.  Grip slightly weak but pt handled small objects well. RUE Sensation: WFL - Light Touch RUE Coordination: Deficits RUE Coordination Deficits: mild coordiation deficits in RUE.  Pt states he had carpel tunnel surgery that did not help with coordiation.  Pt was able to be functional with RUE but cont to have mild coordination deficits. Left Upper Extremity Assessment LUE ROM/Strength/Tone: Within functional levels LUE Sensation: WFL - Light Touch LUE Coordination: WFL - gross/fine motor Trunk Assessment Trunk Assessment: Normal   Mobility  Shoulder Instructions  Bed Mobility Bed Mobility: Supine to Sit;Sitting - Scoot to Edge of Bed Supine to Sit: 5: Supervision Sitting - Scoot to Edge of Bed: 5: Supervision Details for Bed Mobility Assistance: Pt overall S for all mobility during adls. Transfers Transfers: Sit to Stand;Stand to Sit Sit to Stand: 5: Supervision;From bed Stand to Sit: 5: Supervision;To chair/3-in-1 Details for Transfer Assistance: Pt required cues to keep walker close to him when turning to sit.  I think pt is accustomed to walking with cane and leaving cane off to the side when sitting.       Exercise General Exercises - Upper Extremity Shoulder Flexion: AROM;10 reps;Seated;Other (comment) (to 90 degrees) Shoulder ABduction: AROM;5 reps;Seated (to 90 degrees) Digit Composite Flexion: AROM;10 reps;Seated Composite Extension: AROM;10 reps;Seated Hand Exercises Opposition: AROM;10 reps;Right;Seated   Balance Balance Balance Assessed:  No   End of Session OT - End of Session Activity Tolerance: Patient tolerated treatment well Patient left: in chair;with call bell/phone within reach;with nursing in room Nurse Communication: Mobility status  GO     Chase [name], Chase [name] 05/08/2012, 9:29 AM [phone]

## 2012-05-08 NOTE — Progress Notes (Signed)
Physical Therapy Evaluation Patient Details Name: Chase [name] MRN: [id number] DOB: 06/18/1933 Today's Date: 05/08/2012 Time: 1115-1140 PT Time Calculation (min): 25 min  PT Assessment / Plan / Recommendation Clinical Impression  78 yo male s/p ACDF presents with decr functional mobility; will benefit from PT to maximize independence and safety with mobility    PT Assessment  Patient needs continued PT services    Follow Up Recommendations  Home health PT;Supervision/Assistance - 24 hour    Barriers to Discharge None      Equipment Recommendations  None recommended by PT    Recommendations for Other Services     Frequency Min 5X/week    Precautions / Restrictions Precautions Precautions: Cervical Required Braces or Orthoses: Cervical Brace Cervical Brace: Soft collar (for comfort)   Pertinent Vitals/Pain 7/10 Neck pain initially; increased with bed mobility; Repositioned, RN notified      Mobility  Bed Mobility Bed Mobility: Sit to Supine Sit to Supine: 4: Min guard;HOB flat Details for Bed Mobility Assistance: Very painful with sit to sidelie to supine, but not needing physical assist; Assist given at left hand and supporting head on pillow to straighten out in bed Transfers Transfers: Sit to Stand;Stand to Sit Sit to Stand: 5: Supervision;From bed Stand to Sit: 5: Supervision;To chair/3-in-1 Details for Transfer Assistance: Cues for safety and technique Ambulation/Gait Ambulation/Gait Assistance: 4: Min guard;5: Supervision Ambulation Distance (Feet): 140 Feet Assistive device: Rolling walker Ambulation/Gait Assistance Details: Minguard assist progressing to Supervision; Cues for rW proximity and psoture Stairs: Yes Stairs Assistance: 4: [place] Details (indicate cue type and reason): Able to ascend steps reciprocally with rail; descended steps one at a time with LUE support on therapist's shoulder; Pt reports is confident his wife will be  able to give this assist at home Stair Management Technique: One rail Left;Forwards Number of Stairs: 11     Shoulder Instructions     Exercises     PT Diagnosis: Difficulty walking;Acute pain  PT Problem List: Decreased strength;Decreased range of motion;Decreased activity tolerance;Decreased mobility;Decreased knowledge of use of DME;Decreased knowledge of precautions;Pain PT Treatment Interventions: DME instruction;Gait training;Stair training;Functional mobility training;Therapeutic activities;Therapeutic exercise;Patient/family education   PT Goals Acute Rehab PT Goals PT Goal Formulation: With patient Time For Goal Achievement: 05/15/12 Potential to Achieve Goals: Good Pt will Roll Supine to Right Side: with modified independence PT Goal: Rolling Supine to Right Side - Progress: Goal set today Pt will Roll Supine to Left Side: with modified independence PT Goal: Rolling Supine to Left Side - Progress: Goal set today Pt will go Supine/Side to Sit: with modified independence PT Goal: Supine/Side to Sit - Progress: Goal set today Pt will go Sit to Supine/Side: with modified independence PT Goal: Sit to Supine/Side - Progress: Goal set today Pt will go Sit to Stand: with modified independence PT Goal: Sit to Stand - Progress: Goal set today Pt will go Stand to Sit: with modified independence PT Goal: Stand to Sit - Progress: Goal set today Pt will Ambulate: >150 feet;with modified independence;with rolling walker PT Goal: Ambulate - Progress: Goal set today Pt will Go Up / Down Stairs: Flight;with supervision;with rail(s) (and be able to instruct wife in how to assist him) PT Goal: Up/Down Stairs - Progress: Goal set today  Visit Information  Last PT Received On: 05/08/12 Assistance Needed: +1    Subjective Data  Subjective: reports may go home today Patient Stated Goal: To be able to move better   Prior Functioning  Home Living Lives With: Spouse Available Help at  Discharge: Available 24 hours/day Type of Home: House Home Access: Stairs to enter [employer] of Steps: 3 Entrance Stairs-Rails: None Home Layout: Two level Alternate Level Stairs-Number of Steps: 11 Alternate Level Stairs-Rails: Left Bathroom Shower/Tub: Walk-in shower;Door [employer] Toilet: Handicapped height Bathroom Accessibility: Yes How Accessible: Other (comment) Home Adaptive Equipment: Built-in shower seat;Walker - rolling;Quad cane Prior Function Level of Independence: Independent with assistive device(s) Able to Take Stairs?: Yes Driving: Yes Vocation: Retired Corporate investment banker: No difficulties Dominant Hand: Left    Cognition  Overall Cognitive Status: Appears within functional limits for tasks assessed/performed Arousal/Alertness: Awake/alert Orientation Level: Oriented X4 / Intact Behavior During Session: WFL for tasks performed Cognition - Other Comments: Intact.    Extremity/Trunk Assessment Right Upper Extremity Assessment RUE ROM/Strength/Tone: Deficits RUE ROM/Strength/Tone Deficits: See OT note RUE Sensation: WFL - Light Touch RUE Coordination: Deficits RUE Coordination Deficits: See OT note Left Upper Extremity Assessment LUE ROM/Strength/Tone: Within functional levels Right Lower Extremity Assessment RLE ROM/Strength/Tone: WFL for tasks assessed Left Lower Extremity Assessment LLE ROM/Strength/Tone: WFL for tasks assessed Trunk Assessment Trunk Assessment: Normal   Balance    End of Session PT - End of Session Equipment Utilized During Treatment: Gait belt;Cervical collar Activity Tolerance: Patient limited by pain Patient left: in bed;with call bell/phone within reach;with family/visitor present Nurse Communication: Mobility status  GP Functional Assessment Tool Used: Clinical Judgement Functional Limitation: Mobility: Walking and moving around Mobility: Walking and Moving Around Current Status [id number]): At least 1 percent  but less than 20 percent impaired, limited or restricted Mobility: Walking and Moving Around Goal Status [phone]): 0 percent impaired, limited or restricted   [place], [place], [place]  05/08/2012, 2:00 PM

## 2012-05-09 LAB — GLUCOSE, CAPILLARY
Glucose-Capillary: 150 mg/dL — ABNORMAL HIGH (ref 70–99)
Glucose-Capillary: 184 mg/dL — ABNORMAL HIGH (ref 70–99)

## 2012-05-09 MED ORDER — DEXAMETHASONE SODIUM PHOSPHATE 4 MG/ML IJ SOLN
4.0000 mg | Freq: Once | INTRAMUSCULAR | Status: AC
  Administered 2012-05-09: 4 mg via INTRAVENOUS
  Filled 2012-05-09: qty 1

## 2012-05-09 NOTE — Progress Notes (Signed)
Physical Therapy Treatment Patient Details Name: Chase [name] MRN: [id number] DOB: April 20, 1933 Today's Date: 05/09/2012 Time: [id number] PT Time Calculation (min): 13 min  PT Assessment / Plan / Recommendation Comments on Treatment Session  Pt s/p ACDF and progressing well with mobility. Pt unable to recall restrictions and reeducated for precautions and mobility restrictions. Pt encouraged to continue mobility with nursing and continue RW use with gait until more stable.     Follow Up Recommendations       Barriers to Discharge        Equipment Recommendations       Recommendations for Other Services    Frequency     Plan Discharge plan remains appropriate;Frequency remains appropriate    Precautions / Restrictions Precautions Precautions: Cervical Cervical Brace: Soft collar;Other (comment) (for comfort)   Pertinent Vitals/Pain 3/10 surgical pain, RN aware, repositioned    Mobility  Bed Mobility Bed Mobility: Rolling Left;Left Sidelying to Sit;Sit to Sidelying Left Rolling Left: 6: Modified independent (Device/Increase time) Left Sidelying to Sit: 6: Modified independent (Device/Increase time);HOB flat Sit to Sidelying Left: 6: Modified independent (Device/Increase time);HOB flat Details for Bed Mobility Assistance: Pt able to perform without cueing or assist today to left side Transfers Transfers: Stand Pivot Transfers Sit to Stand: 6: Modified independent (Device/Increase time);From bed Stand to Sit: 6: Modified independent (Device/Increase time);To bed;To chair/3-in-1 Stand Pivot Transfers: 6: Modified independent (Device/Increase time) Ambulation/Gait Ambulation/Gait Assistance: 5: Supervision Ambulation Distance (Feet): 400 Feet Assistive device: Rolling walker Ambulation/Gait Assistance Details: cueing for posture and direction to room Gait Pattern: Step-through pattern;Decreased stride length;Trunk flexed Gait velocity: decreased Stairs: No    Exercises      PT Diagnosis:    PT Problem List:   PT Treatment Interventions:     PT Goals Acute Rehab PT Goals PT Goal: Rolling Supine to Left Side - Progress: Met PT Goal: Supine/Side to Sit - Progress: Met PT Goal: Sit to Supine/Side - Progress: Met PT Goal: Sit to Stand - Progress: Met PT Goal: Stand to Sit - Progress: Met PT Goal: Ambulate - Progress: Progressing toward goal  Visit Information  Last PT Received On: 05/09/12 Assistance Needed: +1    Subjective Data  Subjective: I'm doing much better today Patient Stated Goal: get back on my boat   Cognition  Overall Cognitive Status: Appears within functional limits for tasks assessed/performed Arousal/Alertness: Awake/alert Orientation Level: Appears intact for tasks assessed Behavior During Session: [facility] for tasks performed    Balance     End of Session PT - End of Session Activity Tolerance: Patient tolerated treatment well Patient left: in chair;with call bell/phone within reach Nurse Communication: Mobility status   GP     Lanetta Inch Beth 05/09/2012, 8:08 AM Elwyn Reach, [place]

## 2012-05-09 NOTE — Progress Notes (Signed)
Occupational Therapy Treatment and Discharge Note Patient Details Name: Chase [name] MRN: [id number] DOB: 10/02/1932 Today's Date: 05/09/2012 Time: [id number] OT Time Calculation (min): 10 min  OT Assessment / Plan / Recommendation Comments on Treatment Session Pt I with exercises and will follow up at OP.    Follow Up Recommendations  Outpatient OT    Barriers to Discharge       Equipment Recommendations  None recommended by OT    Recommendations for Other Services    Frequency Other (comment) (dc OT)   Plan Discharge plan remains appropriate    Precautions / Restrictions Precautions Precautions: Cervical Required Braces or Orthoses: Cervical Brace Cervical Brace: Soft collar;Other (comment) Restrictions Weight Bearing Restrictions: No   Pertinent Vitals/Pain Pt w 3/10 pain.    ADL       OT Diagnosis:    OT Problem List:   OT Treatment Interventions:     OT Goals Acute Rehab OT Goals OT Goal Formulation: With patient Time For Goal Achievement: 05/17/12 Potential to Achieve Goals: Good ADL Goals Pt Will Perform Upper Body Dressing: with supervision;with cueing (comment type and amount) ADL Goal: Upper Body Dressing - Progress: Met Arm Goals Additional Arm Goal #1: Pt will complete R shoulder ROM exercies (flexion and abdution) and finger opposition exercises Ily. Arm Goal: Additional Goal #1 - Progress: Met  Visit Information  Last OT Received On: 05/09/12 Assistance Needed: +1    Subjective Data      Prior Functioning       Cognition  Overall Cognitive Status: Appears within functional limits for tasks assessed/performed Arousal/Alertness: Awake/alert Orientation Level: Appears intact for tasks assessed Behavior During Session: [facility] for tasks performed    Mobility  Shoulder Instructions Bed Mobility Bed Mobility: Supine to Sit Rolling Left: 6: Modified independent (Device/Increase time) Left Sidelying to Sit: 6: Modified independent  (Device/Increase time);HOB flat Supine to Sit: 7: Independent Sit to Supine: 7: Independent Sit to Sidelying Left: 6: Modified independent (Device/Increase time);HOB flat Details for Bed Mobility Assistance: Pt able to perform without cueing or assist today to left side Transfers Transfers: Sit to Stand;Stand to Sit Sit to Stand: 6: Modified independent (Device/Increase time);From bed Stand to Sit: 6: Modified independent (Device/Increase time);To bed;To chair/3-in-1       Exercises  General Exercises - Upper Extremity Shoulder Flexion: AROM;10 reps;Seated;Other (comment) Shoulder ABduction: AROM;10 reps;Right;Seated Digit Composite Flexion: AROM;10 reps;Seated Composite Extension: AROM;10 reps;Seated   Balance     End of Session OT - End of Session Activity Tolerance: Patient tolerated treatment well Patient left: in bed;with call bell/phone within reach;with family/visitor present Nurse Communication: Mobility status  GO     [name], [place] 05/09/2012, 9:58 AM [phone]

## 2012-05-09 NOTE — Discharge Summary (Signed)
Physician Discharge Summary  Patient ID: Chase [name] MRN: [id number] DOB/AGE: 1933/06/11 78 y.o.  Admit date: 05/07/2012 Discharge date: 05/09/2012  Admission Diagnoses: Cervical disc disease with myelopathy  Discharge Diagnoses: Cervical disc disease with myelopathy C4-5 C5-6 and C6-C7 Active Problems:  * No active hospital problems. *    Discharged Condition: good  Hospital Course: Patient was admitted to undergo a dear cervical decompression C4-5 C5-6 C6-7 he had been on Coumadin anticoagulation preoperatively this was stopped and the patient was bridged with Lovenox during the surgery he had a fair amount of hemorrhagic screws. During the postoperative period he was observed for 48 hours he had some swelling in the region of the neck but his swallowing function is been intact he was treated with high doses of IV Decadron. Because of his diabetes he required some additional insulin coverage. He is discharged home 48 hours after surgery doing reasonably well.  Consults: None  Significant Diagnostic Studies: Cervical spondylosis with radiculopathy  Treatments: Anterior cervical decompression C4-5 C5-6 and C6-7 arthrodesis with structural allograft Alphatec plate fixation [id number]  Discharge Exam: Blood pressure 132/81, pulse 66, temperature 98.7 F (37.1 C), temperature source Oral, resp. rate 18, SpO2 94.00%. Weakness in her right deltoid and bicep 4-5 compared to the left slightly worse than preoperatively moderate ecchymoses around area of incision with moderate swelling in a  plethoric neck  Disposition: 01-Home or Self Care  Discharge Orders    Future Orders Please Complete By Expires   Diet - low sodium heart healthy      Increase activity slowly      Discharge instructions      Comments:   Okay to shower. Do not apply salves or appointments to incision. No heavy lifting with the upper extremities greater than 15 pounds. May resume driving when not requiring pain medication  and patient feels comfortable with doing so.   Call MD for:  redness, tenderness, or signs of infection (pain, swelling, redness, odor or green/yellow discharge around incision site)      Call MD for:  severe uncontrolled pain      Call MD for:  temperature >100.4          Medication List     As of 05/09/2012  5:44 PM    TAKE these medications         acetaminophen 500 MG tablet   Commonly known as: TYLENOL   Take 1,000 mg by mouth every 6 (six) hours as needed. Pain      amLODipine 10 MG tablet   Commonly known as: NORVASC   Take 10 mg by mouth daily.      benazepril 40 MG tablet   Commonly known as: LOTENSIN   Take 40 mg by mouth daily.      diazepam 5 MG tablet   Commonly known as: VALIUM   Take 1 tablet (5 mg total) by mouth every 6 (six) hours as needed ([place]).      fluocinolone 0.01 % external oil   Commonly known as: DERMA-SMOOTHE   Apply 1 application topically daily.      glimepiride 4 MG tablet   Commonly known as: AMARYL   Take 6 mg by mouth daily before breakfast.      indomethacin 25 MG capsule   Commonly known as: INDOCIN   Take 25 mg by mouth 2 (two) times daily as needed. Gout pain.      insulin glargine 100 UNIT/ML injection   Commonly known as: LANTUS  Inject 35-40 Units into the skin at bedtime. 35 units every am, takes 40 units if sugar above 250      LOVENOX [place]   Inject into the skin.      metoprolol 50 MG tablet   Commonly known as: LOPRESSOR   Take 50 mg by mouth 2 (two) times daily.      oxyCODONE-acetaminophen 5-325 MG per tablet   Commonly known as: PERCOCET/ROXICET   Take 1-2 tablets by mouth every 4 (four) hours as needed for pain.      Tamsulosin HCl 0.4 MG Caps   Commonly known as: FLOMAX   Take 0.4 mg by mouth daily after supper.      traMADol 50 MG tablet   Commonly known as: ULTRAM   Take 50 mg by mouth every 6 (six) hours as needed. Pain      warfarin 5 MG tablet   Commonly known as: COUMADIN   Take 7.5-10 mg by  mouth daily. 7.5 mg all days, except Wednesday takes 10 mg         Signed: Earleen Newport 05/09/2012, 5:44 PM

## 2012-05-13 ENCOUNTER — Encounter (HOSPITAL_COMMUNITY): Payer: Self-pay | Admitting: Neurological Surgery

## 2012-05-17 ENCOUNTER — Ambulatory Visit: Payer: Medicare Other | Attending: Neurological Surgery | Admitting: *Deleted

## 2012-05-17 ENCOUNTER — Ambulatory Visit: Payer: Medicare Other | Admitting: Physical Therapy

## 2012-05-17 DIAGNOSIS — I4891 Unspecified atrial fibrillation: Secondary | ICD-10-CM | POA: Diagnosis not present

## 2012-05-17 DIAGNOSIS — M25549 Pain in joints of unspecified hand: Secondary | ICD-10-CM | POA: Diagnosis not present

## 2012-05-17 DIAGNOSIS — Z7901 Long term (current) use of anticoagulants: Secondary | ICD-10-CM | POA: Diagnosis not present

## 2012-05-17 DIAGNOSIS — Z5189 Encounter for other specified aftercare: Secondary | ICD-10-CM | POA: Diagnosis not present

## 2012-05-17 DIAGNOSIS — R262 Difficulty in walking, not elsewhere classified: Secondary | ICD-10-CM | POA: Insufficient documentation

## 2012-05-17 DIAGNOSIS — R279 Unspecified lack of coordination: Secondary | ICD-10-CM | POA: Diagnosis not present

## 2012-05-17 DIAGNOSIS — M6281 Muscle weakness (generalized): Secondary | ICD-10-CM | POA: Diagnosis not present

## 2012-05-17 DIAGNOSIS — M25519 Pain in unspecified shoulder: Secondary | ICD-10-CM | POA: Diagnosis not present

## 2012-05-17 DIAGNOSIS — R293 Abnormal posture: Secondary | ICD-10-CM | POA: Insufficient documentation

## 2012-05-23 DIAGNOSIS — M4712 Other spondylosis with myelopathy, cervical region: Secondary | ICD-10-CM | POA: Diagnosis not present

## 2012-05-24 ENCOUNTER — Ambulatory Visit: Payer: Medicare Other | Admitting: *Deleted

## 2012-05-27 ENCOUNTER — Ambulatory Visit: Payer: Medicare Other | Admitting: Physical Therapy

## 2012-05-28 DIAGNOSIS — M109 Gout, unspecified: Secondary | ICD-10-CM | POA: Diagnosis not present

## 2012-05-28 DIAGNOSIS — M7660 Achilles tendinitis, unspecified leg: Secondary | ICD-10-CM | POA: Diagnosis not present

## 2012-05-30 DIAGNOSIS — Z23 Encounter for immunization: Secondary | ICD-10-CM | POA: Diagnosis not present

## 2012-06-13 DIAGNOSIS — Z7901 Long term (current) use of anticoagulants: Secondary | ICD-10-CM | POA: Diagnosis not present

## 2012-06-13 DIAGNOSIS — I4891 Unspecified atrial fibrillation: Secondary | ICD-10-CM | POA: Diagnosis not present

## 2012-06-13 DIAGNOSIS — I2699 Other pulmonary embolism without acute cor pulmonale: Secondary | ICD-10-CM | POA: Diagnosis not present

## 2012-06-13 DIAGNOSIS — M7750 Other enthesopathy of unspecified foot: Secondary | ICD-10-CM | POA: Diagnosis not present

## 2012-06-17 ENCOUNTER — Ambulatory Visit: Payer: Medicare Other | Attending: Family Medicine | Admitting: Physical Therapy

## 2012-06-17 ENCOUNTER — Ambulatory Visit: Payer: Medicare Other | Admitting: Occupational Therapy

## 2012-06-17 DIAGNOSIS — M25519 Pain in unspecified shoulder: Secondary | ICD-10-CM | POA: Diagnosis not present

## 2012-06-17 DIAGNOSIS — M6281 Muscle weakness (generalized): Secondary | ICD-10-CM | POA: Diagnosis not present

## 2012-06-17 DIAGNOSIS — R293 Abnormal posture: Secondary | ICD-10-CM | POA: Diagnosis not present

## 2012-06-17 DIAGNOSIS — Z5189 Encounter for other specified aftercare: Secondary | ICD-10-CM | POA: Insufficient documentation

## 2012-06-17 DIAGNOSIS — R279 Unspecified lack of coordination: Secondary | ICD-10-CM | POA: Diagnosis not present

## 2012-06-17 DIAGNOSIS — R262 Difficulty in walking, not elsewhere classified: Secondary | ICD-10-CM | POA: Diagnosis not present

## 2012-06-17 DIAGNOSIS — M25549 Pain in joints of unspecified hand: Secondary | ICD-10-CM | POA: Diagnosis not present

## 2012-06-20 ENCOUNTER — Ambulatory Visit: Payer: Medicare Other | Admitting: Occupational Therapy

## 2012-06-20 ENCOUNTER — Ambulatory Visit: Payer: Medicare Other | Admitting: Physical Therapy

## 2012-06-25 ENCOUNTER — Ambulatory Visit: Payer: Medicare Other | Admitting: Occupational Therapy

## 2012-06-25 ENCOUNTER — Encounter: Payer: Medicare Other | Admitting: *Deleted

## 2012-06-25 ENCOUNTER — Ambulatory Visit: Payer: Medicare Other | Admitting: Physical Therapy

## 2012-06-27 ENCOUNTER — Ambulatory Visit: Payer: Medicare Other | Admitting: Occupational Therapy

## 2012-06-27 ENCOUNTER — Ambulatory Visit: Payer: Medicare Other | Admitting: Physical Therapy

## 2012-07-02 ENCOUNTER — Encounter: Payer: Medicare Other | Admitting: Occupational Therapy

## 2012-07-03 ENCOUNTER — Encounter: Payer: Medicare Other | Admitting: Occupational Therapy

## 2012-07-08 ENCOUNTER — Encounter: Payer: Medicare Other | Admitting: Occupational Therapy

## 2012-07-16 ENCOUNTER — Encounter: Payer: Medicare Other | Admitting: Occupational Therapy

## 2012-07-16 DIAGNOSIS — I10 Essential (primary) hypertension: Secondary | ICD-10-CM | POA: Diagnosis not present

## 2012-07-16 DIAGNOSIS — E782 Mixed hyperlipidemia: Secondary | ICD-10-CM | POA: Diagnosis not present

## 2012-07-16 DIAGNOSIS — M109 Gout, unspecified: Secondary | ICD-10-CM | POA: Diagnosis not present

## 2012-07-16 DIAGNOSIS — L570 Actinic keratosis: Secondary | ICD-10-CM | POA: Diagnosis not present

## 2012-07-16 DIAGNOSIS — M2550 Pain in unspecified joint: Secondary | ICD-10-CM | POA: Diagnosis not present

## 2012-07-18 ENCOUNTER — Encounter: Payer: Medicare Other | Admitting: Occupational Therapy

## 2012-07-25 DIAGNOSIS — I82409 Acute embolism and thrombosis of unspecified deep veins of unspecified lower extremity: Secondary | ICD-10-CM | POA: Diagnosis not present

## 2012-07-25 DIAGNOSIS — Z7901 Long term (current) use of anticoagulants: Secondary | ICD-10-CM | POA: Diagnosis not present

## 2012-08-19 DIAGNOSIS — I10 Essential (primary) hypertension: Secondary | ICD-10-CM | POA: Diagnosis not present

## 2012-08-19 DIAGNOSIS — M109 Gout, unspecified: Secondary | ICD-10-CM | POA: Diagnosis not present

## 2012-08-19 DIAGNOSIS — E782 Mixed hyperlipidemia: Secondary | ICD-10-CM | POA: Diagnosis not present

## 2012-08-19 DIAGNOSIS — M1990 Unspecified osteoarthritis, unspecified site: Secondary | ICD-10-CM | POA: Diagnosis not present

## 2012-08-19 DIAGNOSIS — E1129 Type 2 diabetes mellitus with other diabetic kidney complication: Secondary | ICD-10-CM | POA: Diagnosis not present

## 2012-08-19 DIAGNOSIS — E669 Obesity, unspecified: Secondary | ICD-10-CM | POA: Diagnosis not present

## 2012-08-20 DIAGNOSIS — I82409 Acute embolism and thrombosis of unspecified deep veins of unspecified lower extremity: Secondary | ICD-10-CM | POA: Diagnosis not present

## 2012-08-20 DIAGNOSIS — Z7901 Long term (current) use of anticoagulants: Secondary | ICD-10-CM | POA: Diagnosis not present

## 2012-09-24 DIAGNOSIS — Z7901 Long term (current) use of anticoagulants: Secondary | ICD-10-CM | POA: Diagnosis not present

## 2012-09-24 DIAGNOSIS — I82409 Acute embolism and thrombosis of unspecified deep veins of unspecified lower extremity: Secondary | ICD-10-CM | POA: Diagnosis not present

## 2012-10-09 DIAGNOSIS — I82409 Acute embolism and thrombosis of unspecified deep veins of unspecified lower extremity: Secondary | ICD-10-CM | POA: Diagnosis not present

## 2012-10-09 DIAGNOSIS — Z7901 Long term (current) use of anticoagulants: Secondary | ICD-10-CM | POA: Diagnosis not present

## 2012-10-23 DIAGNOSIS — M4712 Other spondylosis with myelopathy, cervical region: Secondary | ICD-10-CM | POA: Diagnosis not present

## 2012-10-23 DIAGNOSIS — M545 Low back pain: Secondary | ICD-10-CM | POA: Diagnosis not present

## 2012-10-30 ENCOUNTER — Ambulatory Visit: Payer: Self-pay | Admitting: Cardiovascular Disease

## 2012-10-30 DIAGNOSIS — I2699 Other pulmonary embolism without acute cor pulmonale: Secondary | ICD-10-CM

## 2012-10-30 DIAGNOSIS — Z86711 Personal history of pulmonary embolism: Secondary | ICD-10-CM | POA: Insufficient documentation

## 2012-10-30 DIAGNOSIS — Z7901 Long term (current) use of anticoagulants: Secondary | ICD-10-CM

## 2012-11-13 DIAGNOSIS — Z7901 Long term (current) use of anticoagulants: Secondary | ICD-10-CM | POA: Diagnosis not present

## 2012-11-13 DIAGNOSIS — I4891 Unspecified atrial fibrillation: Secondary | ICD-10-CM | POA: Diagnosis not present

## 2012-11-25 DIAGNOSIS — E782 Mixed hyperlipidemia: Secondary | ICD-10-CM | POA: Diagnosis not present

## 2012-12-11 DIAGNOSIS — Z7901 Long term (current) use of anticoagulants: Secondary | ICD-10-CM | POA: Diagnosis not present

## 2012-12-11 DIAGNOSIS — I82409 Acute embolism and thrombosis of unspecified deep veins of unspecified lower extremity: Secondary | ICD-10-CM | POA: Diagnosis not present

## 2013-01-02 DIAGNOSIS — E782 Mixed hyperlipidemia: Secondary | ICD-10-CM | POA: Diagnosis not present

## 2013-01-02 DIAGNOSIS — Z6837 Body mass index (BMI) 37.0-37.9, adult: Secondary | ICD-10-CM | POA: Diagnosis not present

## 2013-01-02 DIAGNOSIS — E1149 Type 2 diabetes mellitus with other diabetic neurological complication: Secondary | ICD-10-CM | POA: Diagnosis not present

## 2013-01-02 DIAGNOSIS — E1129 Type 2 diabetes mellitus with other diabetic kidney complication: Secondary | ICD-10-CM | POA: Diagnosis not present

## 2013-01-02 DIAGNOSIS — I129 Hypertensive chronic kidney disease with stage 1 through stage 4 chronic kidney disease, or unspecified chronic kidney disease: Secondary | ICD-10-CM | POA: Diagnosis not present

## 2013-01-02 DIAGNOSIS — Z0000 Encounter for general adult medical examination without abnormal findings: Secondary | ICD-10-CM | POA: Diagnosis not present

## 2013-01-08 ENCOUNTER — Encounter: Payer: Self-pay | Admitting: Cardiovascular Disease

## 2013-01-22 ENCOUNTER — Ambulatory Visit (INDEPENDENT_AMBULATORY_CARE_PROVIDER_SITE_OTHER): Payer: Medicare Other | Admitting: Pharmacist Clinician (PhC)/ Clinical Pharmacy Specialist

## 2013-01-22 VITALS — BP 160/72 | HR 72

## 2013-01-22 DIAGNOSIS — I2699 Other pulmonary embolism without acute cor pulmonale: Secondary | ICD-10-CM

## 2013-01-22 DIAGNOSIS — Z7901 Long term (current) use of anticoagulants: Secondary | ICD-10-CM | POA: Diagnosis not present

## 2013-01-22 LAB — POCT INR: INR: 2

## 2013-02-09 IMAGING — CT CT ABD-PELV W/O CM
3 of 4 series · 12 of 36 positions shown, 18 images · IV contrast (READICAT/WATER)
Comparison: CT abdomen pelvis of 04/11/2010

CLINICAL DATA: Left lower quadrant pain, constipation

CT ABDOMEN AND PELVIS WITHOUT CONTRAST
TECHNIQUE: Multidetector CT imaging of the abdomen and pelvis was
performed following the standard protocol without intravenous
contrast.

[Series 3: abd/pelvis w/o · axial · non-contrast · 0.97mm/px · z∈[-417,-32]mm · 8 of 100 slices shown, 13 images]
[im 12/100  soft-tissue]
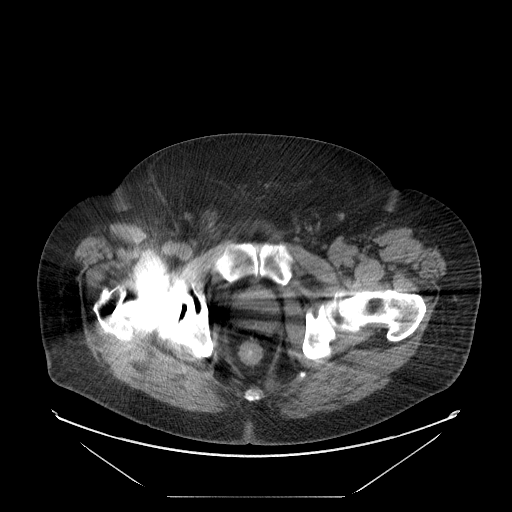
[im 12/100  bone]
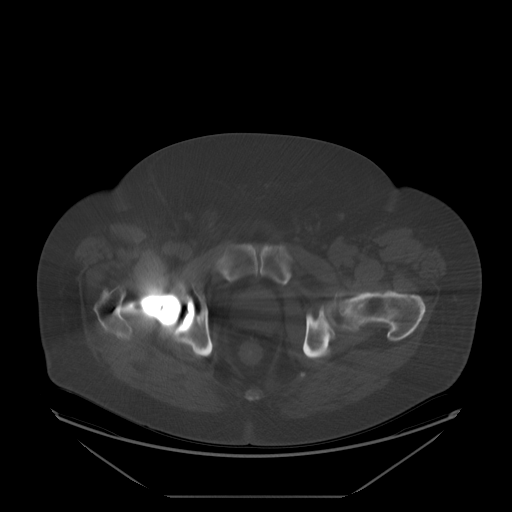
[im 23/100  soft-tissue]
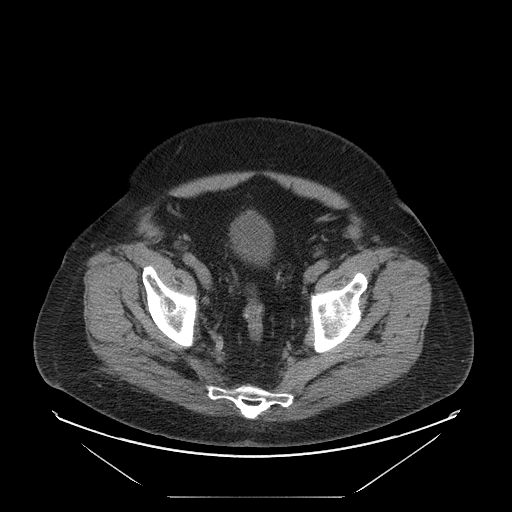
[im 34/100  soft-tissue]
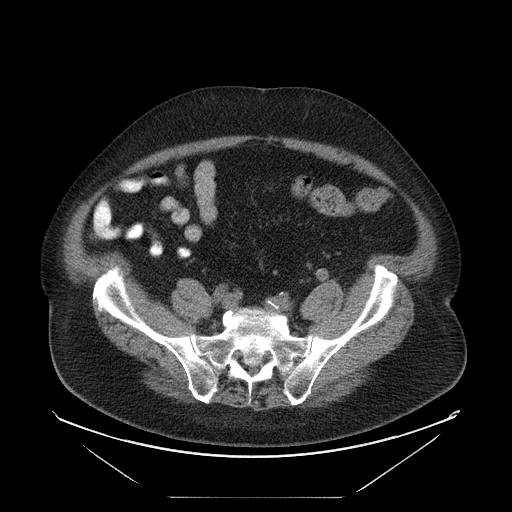
[im 45/100  soft-tissue]
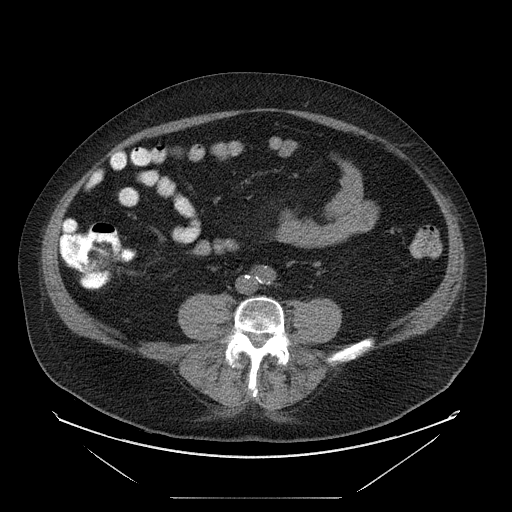
[im 56/100  soft-tissue]
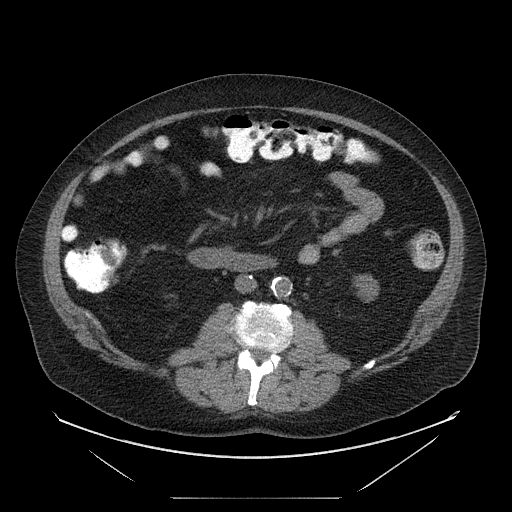
[im 56/100  lung]
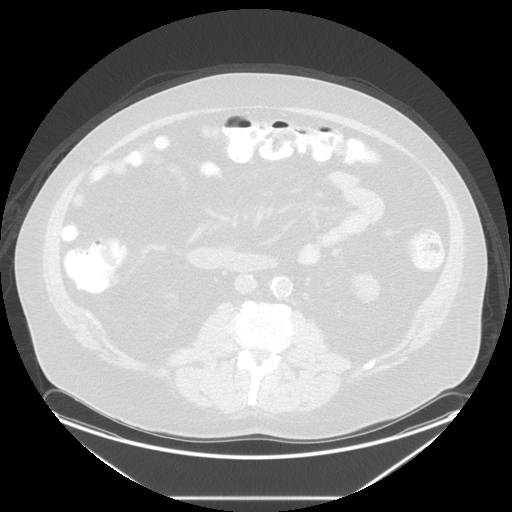
[im 67/100  soft-tissue]
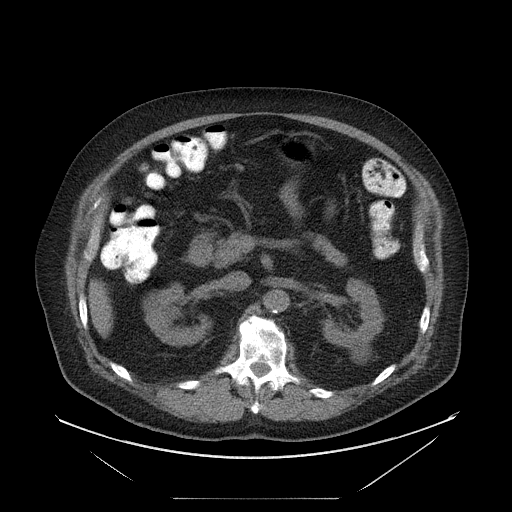
[im 67/100  lung]
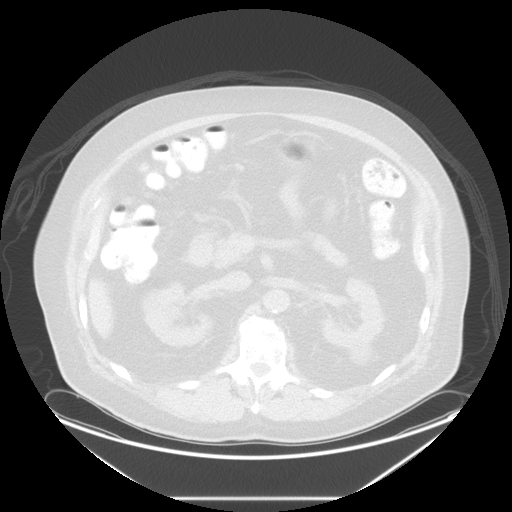
[im 78/100  soft-tissue]
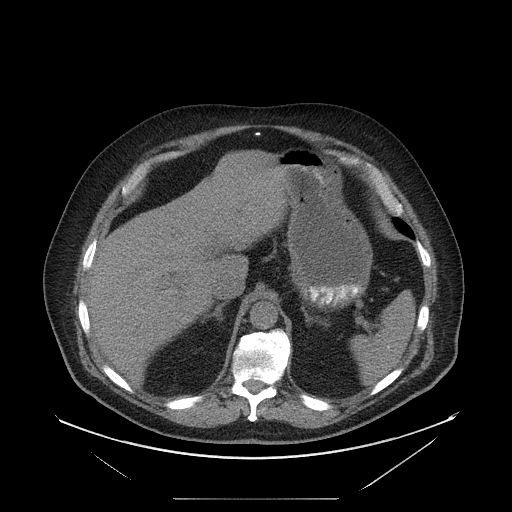
[im 78/100  lung]
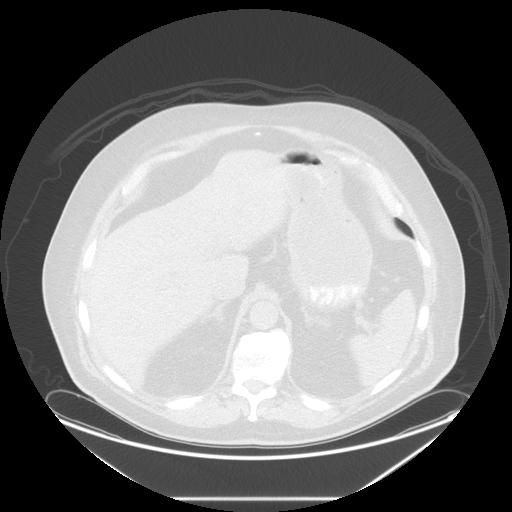
[im 89/100  soft-tissue]
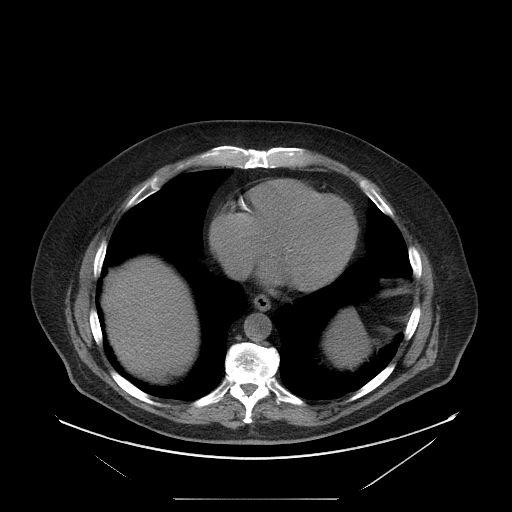
[im 89/100  lung]
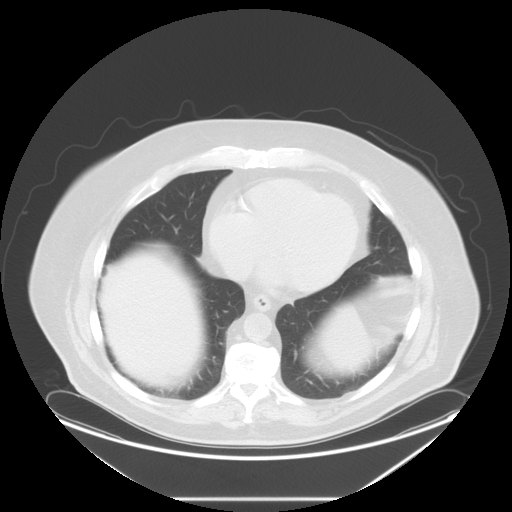

[Series 601: coronal body · coronal · 0.97mm/px · 1 of 152 slices shown, 2 images]
[im 51/152  soft-tissue]
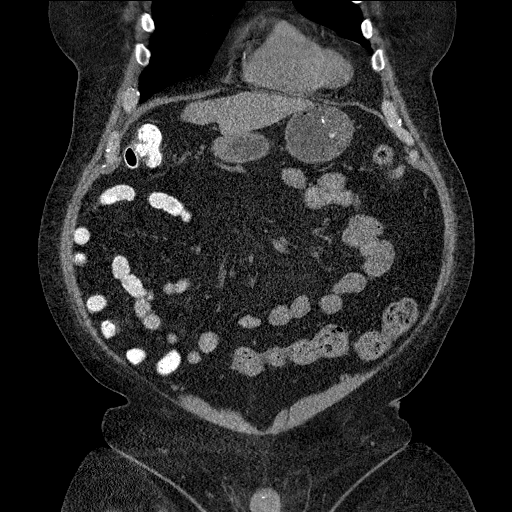
[im 51/152  bone]
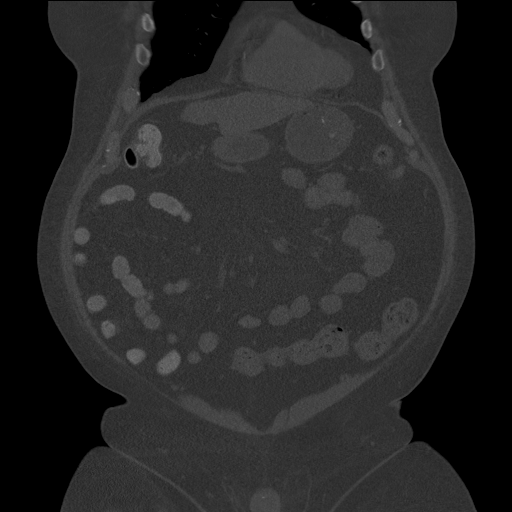

[Series 602: sagittal body · sagittal · 0.97mm/px · 3 of 191 slices shown]
[im 22/191  soft-tissue]
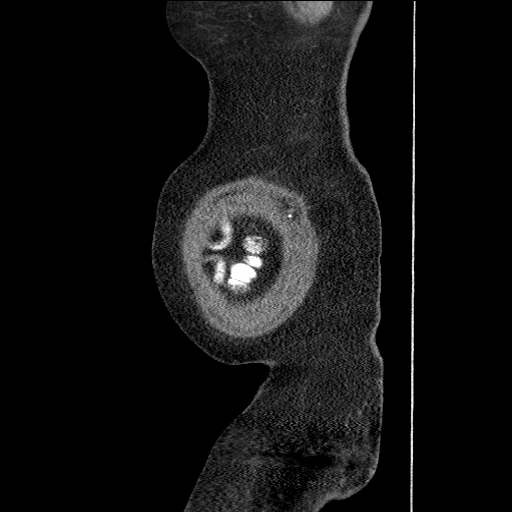
[im 43/191  soft-tissue]
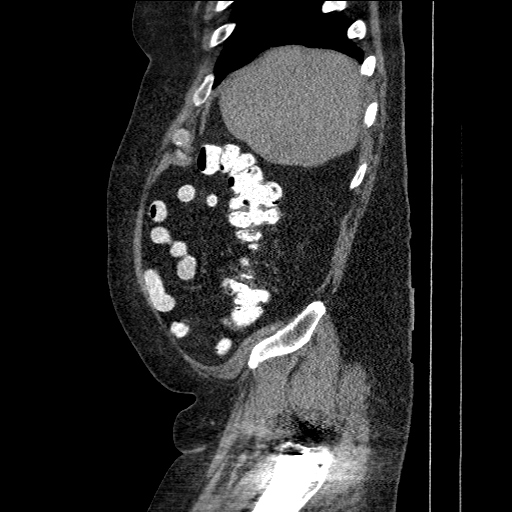
[im 64/191  soft-tissue]
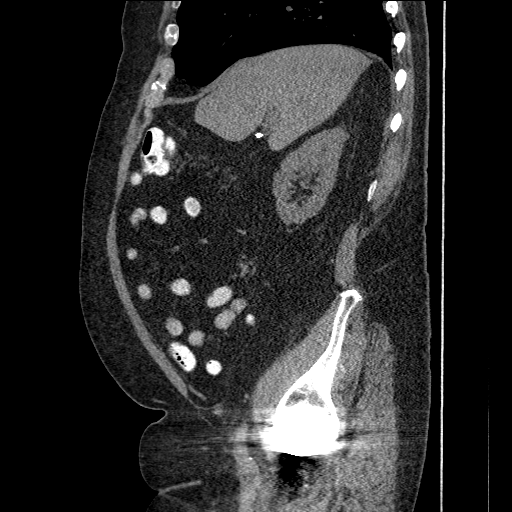

[12 of 36 positions shown; findings below may reference images not displayed]

FINDINGS: The lung bases are clear.  There are coronary artery
calcifications in the region of the left anterior descending
artery.  The liver is unremarkable in the unenhanced state.  The
gallbladder has previously been resected.  The pancreas is normal
in size and the pancreatic duct is not dilated.  The adrenal glands
and spleen are unremarkable.  The stomach is moderately fluid
distended with no abnormality noted.  As noted previously multiple
rounded low attenuation renal lesions are present most consistent
with cyst, but difficult to assess on this unenhanced study.  No
hydronephrosis is seen. A small nonobstructing calculus is present
in the lower pole collecting system of the right kidney.  The right
total hip replacement is noted.  There is degenerative disc disease
at the L5 S1 level. The abdominal aorta is normal in caliber with
moderate atheromatous change.  No adenopathy is seen.

The urinary bladder is not well distended but no abnormality is
seen.  The prostate is normal in size.  No fluid is noted within
the pelvis.  No significant colonic diverticula are seen and no
diverticulitis is noted.  The terminal ileum is unremarkable.  The
appendix has previously been resected.  The small bowel is not
distended.
IMPRESSION: 1.  No explanation for the patient's pain is seen.  There is no
evidence of diverticulitis.
2.  The terminal ileum appears normal.
3.  Degenerative disc disease at L5-S1.
4.  Coronary artery calcifications.
5.  Stable rounded renal lesions are most consistent with cysts,
but are difficult to assess on this unenhanced study.
Nonobstructing lower pole right renal calculus.

## 2013-02-17 ENCOUNTER — Other Ambulatory Visit: Payer: Self-pay | Admitting: Family Medicine

## 2013-02-17 DIAGNOSIS — C44320 Squamous cell carcinoma of skin of unspecified parts of face: Secondary | ICD-10-CM | POA: Diagnosis not present

## 2013-02-17 DIAGNOSIS — L570 Actinic keratosis: Secondary | ICD-10-CM | POA: Diagnosis not present

## 2013-02-17 DIAGNOSIS — C44221 Squamous cell carcinoma of skin of unspecified ear and external auricular canal: Secondary | ICD-10-CM | POA: Diagnosis not present

## 2013-02-27 DIAGNOSIS — E782 Mixed hyperlipidemia: Secondary | ICD-10-CM | POA: Diagnosis not present

## 2013-03-05 ENCOUNTER — Ambulatory Visit (INDEPENDENT_AMBULATORY_CARE_PROVIDER_SITE_OTHER): Payer: Medicare Other | Admitting: Pharmacist Clinician (PhC)/ Clinical Pharmacy Specialist

## 2013-03-05 VITALS — BP 148/70 | HR 60

## 2013-03-05 DIAGNOSIS — Z7901 Long term (current) use of anticoagulants: Secondary | ICD-10-CM | POA: Diagnosis not present

## 2013-03-05 DIAGNOSIS — I2699 Other pulmonary embolism without acute cor pulmonale: Secondary | ICD-10-CM | POA: Diagnosis not present

## 2013-03-05 MED ORDER — WARFARIN SODIUM 5 MG PO TABS: ORAL_TABLET | ORAL | Status: DC

## 2013-03-13 DIAGNOSIS — C44320 Squamous cell carcinoma of skin of unspecified parts of face: Secondary | ICD-10-CM | POA: Diagnosis not present

## 2013-03-13 DIAGNOSIS — Z85828 Personal history of other malignant neoplasm of skin: Secondary | ICD-10-CM | POA: Diagnosis not present

## 2013-03-20 DIAGNOSIS — L570 Actinic keratosis: Secondary | ICD-10-CM | POA: Diagnosis not present

## 2013-04-02 ENCOUNTER — Ambulatory Visit (INDEPENDENT_AMBULATORY_CARE_PROVIDER_SITE_OTHER): Payer: Medicare Other | Admitting: Pharmacist Clinician (PhC)/ Clinical Pharmacy Specialist

## 2013-04-02 VITALS — BP 150/80 | HR 72

## 2013-04-02 DIAGNOSIS — I2699 Other pulmonary embolism without acute cor pulmonale: Secondary | ICD-10-CM | POA: Diagnosis not present

## 2013-04-02 DIAGNOSIS — Z7901 Long term (current) use of anticoagulants: Secondary | ICD-10-CM

## 2013-05-14 ENCOUNTER — Ambulatory Visit (INDEPENDENT_AMBULATORY_CARE_PROVIDER_SITE_OTHER): Payer: Medicare Other | Admitting: Pharmacist Clinician (PhC)/ Clinical Pharmacy Specialist

## 2013-05-14 VITALS — BP 160/80 | HR 60

## 2013-05-14 DIAGNOSIS — Z7901 Long term (current) use of anticoagulants: Secondary | ICD-10-CM

## 2013-05-14 DIAGNOSIS — I2699 Other pulmonary embolism without acute cor pulmonale: Secondary | ICD-10-CM

## 2013-05-16 ENCOUNTER — Telehealth: Payer: Self-pay | Admitting: Pharmacist Clinician (PhC)/ Clinical Pharmacy Specialist

## 2013-05-16 NOTE — Telephone Encounter (Signed)
LMOM for pt to contact CPAP supply company.

## 2013-05-16 NOTE — Telephone Encounter (Signed)
Message copied by Rockne Menghini on Fri May 16, 2013  2:56 PM ------      Message from: Lauralee Evener.      Created: Thu May 15, 2013  8:11 AM       If he does not know where he got it from then i don't know what to tell him. Where does he get his supplies from? They should be able to assist him.. If it was SMS before they went out of business then I will need to refer him to another company.      ----- Message -----         From: Tommy Medal, RPH-CPP         Sent: 05/14/2013  11:08 AM           To: Lauralee Evener, CMA            Mariann Laster             Mr. Rei' CPAP machine is broken, who does he need to contact - he's not sure where he got it.            Erasmo Downer       ------

## 2013-05-26 DIAGNOSIS — Z23 Encounter for immunization: Secondary | ICD-10-CM | POA: Diagnosis not present

## 2013-05-26 DIAGNOSIS — I10 Essential (primary) hypertension: Secondary | ICD-10-CM | POA: Diagnosis not present

## 2013-06-19 DIAGNOSIS — L738 Other specified follicular disorders: Secondary | ICD-10-CM | POA: Diagnosis not present

## 2013-06-19 DIAGNOSIS — D1801 Hemangioma of skin and subcutaneous tissue: Secondary | ICD-10-CM | POA: Diagnosis not present

## 2013-06-19 DIAGNOSIS — D692 Other nonthrombocytopenic purpura: Secondary | ICD-10-CM | POA: Diagnosis not present

## 2013-06-19 DIAGNOSIS — L570 Actinic keratosis: Secondary | ICD-10-CM | POA: Diagnosis not present

## 2013-06-19 DIAGNOSIS — C44611 Basal cell carcinoma of skin of unspecified upper limb, including shoulder: Secondary | ICD-10-CM | POA: Diagnosis not present

## 2013-06-19 DIAGNOSIS — Z85828 Personal history of other malignant neoplasm of skin: Secondary | ICD-10-CM | POA: Diagnosis not present

## 2013-06-19 DIAGNOSIS — L821 Other seborrheic keratosis: Secondary | ICD-10-CM | POA: Diagnosis not present

## 2013-06-19 DIAGNOSIS — L819 Disorder of pigmentation, unspecified: Secondary | ICD-10-CM | POA: Diagnosis not present

## 2013-06-19 DIAGNOSIS — D485 Neoplasm of uncertain behavior of skin: Secondary | ICD-10-CM | POA: Diagnosis not present

## 2013-06-25 ENCOUNTER — Ambulatory Visit (INDEPENDENT_AMBULATORY_CARE_PROVIDER_SITE_OTHER): Payer: Medicare Other | Admitting: Pharmacist Clinician (PhC)/ Clinical Pharmacy Specialist

## 2013-06-25 VITALS — BP 160/64 | HR 68

## 2013-06-25 DIAGNOSIS — I2699 Other pulmonary embolism without acute cor pulmonale: Secondary | ICD-10-CM | POA: Diagnosis not present

## 2013-06-25 DIAGNOSIS — Z7901 Long term (current) use of anticoagulants: Secondary | ICD-10-CM | POA: Diagnosis not present

## 2013-06-25 LAB — POCT INR: INR: 2.1

## 2013-07-09 DIAGNOSIS — I10 Essential (primary) hypertension: Secondary | ICD-10-CM | POA: Diagnosis not present

## 2013-07-09 DIAGNOSIS — E1149 Type 2 diabetes mellitus with other diabetic neurological complication: Secondary | ICD-10-CM | POA: Diagnosis not present

## 2013-07-09 DIAGNOSIS — G609 Hereditary and idiopathic neuropathy, unspecified: Secondary | ICD-10-CM | POA: Diagnosis not present

## 2013-07-09 DIAGNOSIS — N058 Unspecified nephritic syndrome with other morphologic changes: Secondary | ICD-10-CM | POA: Diagnosis not present

## 2013-07-09 DIAGNOSIS — Z6837 Body mass index (BMI) 37.0-37.9, adult: Secondary | ICD-10-CM | POA: Diagnosis not present

## 2013-07-09 DIAGNOSIS — E1129 Type 2 diabetes mellitus with other diabetic kidney complication: Secondary | ICD-10-CM | POA: Diagnosis not present

## 2013-07-18 ENCOUNTER — Other Ambulatory Visit: Payer: Self-pay | Admitting: Pharmacist Clinician (PhC)/ Clinical Pharmacy Specialist

## 2013-07-18 MED ORDER — WARFARIN SODIUM 5 MG PO TABS: ORAL_TABLET | ORAL | Status: DC

## 2013-09-01 ENCOUNTER — Ambulatory Visit (INDEPENDENT_AMBULATORY_CARE_PROVIDER_SITE_OTHER): Payer: Medicare Other | Admitting: Pharmacist Clinician (PhC)/ Clinical Pharmacy Specialist

## 2013-09-01 VITALS — BP 160/62 | HR 64

## 2013-09-01 DIAGNOSIS — Z5181 Encounter for therapeutic drug level monitoring: Secondary | ICD-10-CM

## 2013-09-01 DIAGNOSIS — I2699 Other pulmonary embolism without acute cor pulmonale: Secondary | ICD-10-CM | POA: Diagnosis not present

## 2013-09-01 DIAGNOSIS — Z7901 Long term (current) use of anticoagulants: Secondary | ICD-10-CM

## 2013-09-01 LAB — POCT INR: INR: 2.1

## 2013-10-13 DIAGNOSIS — E782 Mixed hyperlipidemia: Secondary | ICD-10-CM | POA: Diagnosis not present

## 2013-10-15 ENCOUNTER — Ambulatory Visit (INDEPENDENT_AMBULATORY_CARE_PROVIDER_SITE_OTHER): Payer: Medicare Other | Admitting: Pharmacist Clinician (PhC)/ Clinical Pharmacy Specialist

## 2013-10-15 VITALS — BP 140/62 | HR 64

## 2013-10-15 DIAGNOSIS — Z7901 Long term (current) use of anticoagulants: Secondary | ICD-10-CM | POA: Diagnosis not present

## 2013-10-15 DIAGNOSIS — I2699 Other pulmonary embolism without acute cor pulmonale: Secondary | ICD-10-CM | POA: Diagnosis not present

## 2013-10-15 LAB — POCT INR: INR: 1.8

## 2013-11-26 ENCOUNTER — Ambulatory Visit (INDEPENDENT_AMBULATORY_CARE_PROVIDER_SITE_OTHER): Payer: Medicare Other | Admitting: Pharmacist Clinician (PhC)/ Clinical Pharmacy Specialist

## 2013-11-26 VITALS — BP 142/70 | HR 68

## 2013-11-26 DIAGNOSIS — Z7901 Long term (current) use of anticoagulants: Secondary | ICD-10-CM | POA: Diagnosis not present

## 2013-11-26 DIAGNOSIS — I2699 Other pulmonary embolism without acute cor pulmonale: Secondary | ICD-10-CM

## 2013-11-26 LAB — POCT INR: INR: 2.3

## 2013-12-17 ENCOUNTER — Telehealth: Payer: Self-pay | Admitting: *Deleted

## 2013-12-17 NOTE — Telephone Encounter (Signed)
Faxed supply order to [facility]

## 2014-01-07 ENCOUNTER — Ambulatory Visit (INDEPENDENT_AMBULATORY_CARE_PROVIDER_SITE_OTHER): Payer: Medicare Other | Admitting: Pharmacist Clinician (PhC)/ Clinical Pharmacy Specialist

## 2014-01-07 DIAGNOSIS — Z7901 Long term (current) use of anticoagulants: Secondary | ICD-10-CM | POA: Diagnosis not present

## 2014-01-07 DIAGNOSIS — I2699 Other pulmonary embolism without acute cor pulmonale: Secondary | ICD-10-CM

## 2014-01-07 LAB — POCT INR: INR: 2.2

## 2014-01-22 ENCOUNTER — Telehealth: Payer: Self-pay | Admitting: Pharmacist Clinician (PhC)/ Clinical Pharmacy Specialist

## 2014-01-23 NOTE — Telephone Encounter (Signed)
Closed encounter °

## 2014-02-18 ENCOUNTER — Ambulatory Visit (INDEPENDENT_AMBULATORY_CARE_PROVIDER_SITE_OTHER): Payer: Medicare Other | Admitting: Pharmacist

## 2014-02-18 DIAGNOSIS — I2699 Other pulmonary embolism without acute cor pulmonale: Secondary | ICD-10-CM

## 2014-02-18 DIAGNOSIS — Z7901 Long term (current) use of anticoagulants: Secondary | ICD-10-CM

## 2014-02-18 LAB — POCT INR: INR: 2.2

## 2014-03-31 DIAGNOSIS — E119 Type 2 diabetes mellitus without complications: Secondary | ICD-10-CM | POA: Diagnosis not present

## 2014-04-01 ENCOUNTER — Ambulatory Visit (INDEPENDENT_AMBULATORY_CARE_PROVIDER_SITE_OTHER): Payer: Medicare Other | Admitting: Pharmacist Clinician (PhC)/ Clinical Pharmacy Specialist

## 2014-04-01 DIAGNOSIS — I2699 Other pulmonary embolism without acute cor pulmonale: Secondary | ICD-10-CM

## 2014-04-01 DIAGNOSIS — Z7901 Long term (current) use of anticoagulants: Secondary | ICD-10-CM | POA: Diagnosis not present

## 2014-04-01 LAB — POCT INR: INR: 2.5

## 2014-04-15 ENCOUNTER — Other Ambulatory Visit: Payer: Self-pay | Admitting: Family Medicine

## 2014-04-15 DIAGNOSIS — Z6838 Body mass index (BMI) 38.0-38.9, adult: Secondary | ICD-10-CM | POA: Diagnosis not present

## 2014-04-15 DIAGNOSIS — Z23 Encounter for immunization: Secondary | ICD-10-CM | POA: Diagnosis not present

## 2014-04-15 DIAGNOSIS — I129 Hypertensive chronic kidney disease with stage 1 through stage 4 chronic kidney disease, or unspecified chronic kidney disease: Secondary | ICD-10-CM | POA: Diagnosis not present

## 2014-04-15 DIAGNOSIS — N5089 Other specified disorders of the male genital organs: Secondary | ICD-10-CM

## 2014-04-15 DIAGNOSIS — E782 Mixed hyperlipidemia: Secondary | ICD-10-CM | POA: Diagnosis not present

## 2014-04-15 DIAGNOSIS — G609 Hereditary and idiopathic neuropathy, unspecified: Secondary | ICD-10-CM | POA: Diagnosis not present

## 2014-04-15 DIAGNOSIS — M109 Gout, unspecified: Secondary | ICD-10-CM | POA: Diagnosis not present

## 2014-04-15 DIAGNOSIS — E1149 Type 2 diabetes mellitus with other diabetic neurological complication: Secondary | ICD-10-CM | POA: Diagnosis not present

## 2014-04-15 DIAGNOSIS — Z0000 Encounter for general adult medical examination without abnormal findings: Secondary | ICD-10-CM | POA: Diagnosis not present

## 2014-04-15 DIAGNOSIS — E1129 Type 2 diabetes mellitus with other diabetic kidney complication: Secondary | ICD-10-CM | POA: Diagnosis not present

## 2014-04-17 ENCOUNTER — Ambulatory Visit
Admission: RE | Admit: 2014-04-17 | Discharge: 2014-04-17 | Disposition: A | Discharge status: Home / Self Care | Payer: Medicare Other | Source: Ambulatory Visit | Attending: Family Medicine | Admitting: Family Medicine

## 2014-04-17 DIAGNOSIS — N433 Hydrocele, unspecified: Secondary | ICD-10-CM | POA: Diagnosis not present

## 2014-04-17 DIAGNOSIS — N5089 Other specified disorders of the male genital organs: Secondary | ICD-10-CM

## 2014-05-13 ENCOUNTER — Ambulatory Visit (INDEPENDENT_AMBULATORY_CARE_PROVIDER_SITE_OTHER): Payer: Medicare Other | Admitting: Pharmacist Clinician (PhC)/ Clinical Pharmacy Specialist

## 2014-05-13 DIAGNOSIS — Z7901 Long term (current) use of anticoagulants: Secondary | ICD-10-CM | POA: Diagnosis not present

## 2014-05-13 DIAGNOSIS — I2699 Other pulmonary embolism without acute cor pulmonale: Secondary | ICD-10-CM

## 2014-05-13 LAB — POCT INR: INR: 4

## 2014-05-29 ENCOUNTER — Ambulatory Visit (INDEPENDENT_AMBULATORY_CARE_PROVIDER_SITE_OTHER): Payer: Medicare Other | Admitting: Pharmacist Clinician (PhC)/ Clinical Pharmacy Specialist

## 2014-05-29 DIAGNOSIS — I2699 Other pulmonary embolism without acute cor pulmonale: Secondary | ICD-10-CM | POA: Diagnosis not present

## 2014-05-29 DIAGNOSIS — Z7901 Long term (current) use of anticoagulants: Secondary | ICD-10-CM

## 2014-05-29 LAB — POCT INR: INR: 3.6

## 2014-06-05 DIAGNOSIS — I8310 Varicose veins of unspecified lower extremity with inflammation: Secondary | ICD-10-CM | POA: Diagnosis not present

## 2014-06-05 DIAGNOSIS — J309 Allergic rhinitis, unspecified: Secondary | ICD-10-CM | POA: Diagnosis not present

## 2014-06-05 DIAGNOSIS — Z86711 Personal history of pulmonary embolism: Secondary | ICD-10-CM | POA: Diagnosis not present

## 2014-06-17 ENCOUNTER — Ambulatory Visit (INDEPENDENT_AMBULATORY_CARE_PROVIDER_SITE_OTHER): Payer: Medicare Other | Admitting: Pharmacist Clinician (PhC)/ Clinical Pharmacy Specialist

## 2014-06-17 DIAGNOSIS — Z7901 Long term (current) use of anticoagulants: Secondary | ICD-10-CM

## 2014-06-17 DIAGNOSIS — I2699 Other pulmonary embolism without acute cor pulmonale: Secondary | ICD-10-CM | POA: Diagnosis not present

## 2014-06-17 LAB — POCT INR: INR: 2

## 2014-07-15 ENCOUNTER — Ambulatory Visit (INDEPENDENT_AMBULATORY_CARE_PROVIDER_SITE_OTHER): Payer: Medicare Other | Admitting: Pharmacist Clinician (PhC)/ Clinical Pharmacy Specialist

## 2014-07-15 DIAGNOSIS — I2699 Other pulmonary embolism without acute cor pulmonale: Secondary | ICD-10-CM | POA: Diagnosis not present

## 2014-07-15 DIAGNOSIS — Z7901 Long term (current) use of anticoagulants: Secondary | ICD-10-CM | POA: Diagnosis not present

## 2014-07-15 LAB — POCT INR: INR: 2.2

## 2014-07-17 ENCOUNTER — Other Ambulatory Visit: Payer: Self-pay | Admitting: Pharmacist Clinician (PhC)/ Clinical Pharmacy Specialist

## 2014-07-17 MED ORDER — WARFARIN SODIUM 5 MG PO TABS: ORAL_TABLET | ORAL | Status: DC

## 2014-08-26 ENCOUNTER — Ambulatory Visit (INDEPENDENT_AMBULATORY_CARE_PROVIDER_SITE_OTHER): Payer: Medicare Other | Admitting: Pharmacist Clinician (PhC)/ Clinical Pharmacy Specialist

## 2014-08-26 DIAGNOSIS — Z7901 Long term (current) use of anticoagulants: Secondary | ICD-10-CM | POA: Diagnosis not present

## 2014-08-26 DIAGNOSIS — I2699 Other pulmonary embolism without acute cor pulmonale: Secondary | ICD-10-CM

## 2014-08-26 LAB — POCT INR: INR: 2.2

## 2014-10-07 ENCOUNTER — Ambulatory Visit (INDEPENDENT_AMBULATORY_CARE_PROVIDER_SITE_OTHER): Payer: Medicare Other | Admitting: Pharmacist Clinician (PhC)/ Clinical Pharmacy Specialist

## 2014-10-07 DIAGNOSIS — Z7901 Long term (current) use of anticoagulants: Secondary | ICD-10-CM

## 2014-10-07 DIAGNOSIS — I2699 Other pulmonary embolism without acute cor pulmonale: Secondary | ICD-10-CM | POA: Diagnosis not present

## 2014-10-07 LAB — POCT INR: INR: 2.8

## 2014-10-14 DIAGNOSIS — E1122 Type 2 diabetes mellitus with diabetic chronic kidney disease: Secondary | ICD-10-CM | POA: Diagnosis not present

## 2014-10-14 DIAGNOSIS — I10 Essential (primary) hypertension: Secondary | ICD-10-CM | POA: Diagnosis not present

## 2014-10-14 DIAGNOSIS — M150 Primary generalized (osteo)arthritis: Secondary | ICD-10-CM | POA: Diagnosis not present

## 2014-10-14 DIAGNOSIS — E782 Mixed hyperlipidemia: Secondary | ICD-10-CM | POA: Diagnosis not present

## 2014-10-14 DIAGNOSIS — E1142 Type 2 diabetes mellitus with diabetic polyneuropathy: Secondary | ICD-10-CM | POA: Diagnosis not present

## 2014-10-14 DIAGNOSIS — T148 Other injury of unspecified body region: Secondary | ICD-10-CM | POA: Diagnosis not present

## 2014-11-18 ENCOUNTER — Ambulatory Visit: Payer: Medicare Other | Admitting: Pharmacist Clinician (PhC)/ Clinical Pharmacy Specialist

## 2014-11-25 ENCOUNTER — Ambulatory Visit (INDEPENDENT_AMBULATORY_CARE_PROVIDER_SITE_OTHER): Payer: Medicare Other | Admitting: Pharmacist Clinician (PhC)/ Clinical Pharmacy Specialist

## 2014-11-25 DIAGNOSIS — I2699 Other pulmonary embolism without acute cor pulmonale: Secondary | ICD-10-CM | POA: Diagnosis not present

## 2014-11-25 DIAGNOSIS — Z7901 Long term (current) use of anticoagulants: Secondary | ICD-10-CM

## 2014-11-25 LAB — POCT INR: INR: 2.9

## 2014-12-13 DEATH — deceased

## 2015-01-06 ENCOUNTER — Ambulatory Visit (INDEPENDENT_AMBULATORY_CARE_PROVIDER_SITE_OTHER): Payer: Medicare Other | Admitting: Pharmacist Clinician (PhC)/ Clinical Pharmacy Specialist

## 2015-01-06 DIAGNOSIS — I2699 Other pulmonary embolism without acute cor pulmonale: Secondary | ICD-10-CM | POA: Diagnosis not present

## 2015-01-06 DIAGNOSIS — Z7901 Long term (current) use of anticoagulants: Secondary | ICD-10-CM

## 2015-01-06 LAB — POCT INR: INR: 1.7

## 2015-02-17 ENCOUNTER — Ambulatory Visit (INDEPENDENT_AMBULATORY_CARE_PROVIDER_SITE_OTHER): Payer: Medicare Other | Admitting: Pharmacist Clinician (PhC)/ Clinical Pharmacy Specialist

## 2015-02-17 DIAGNOSIS — Z7901 Long term (current) use of anticoagulants: Secondary | ICD-10-CM | POA: Diagnosis not present

## 2015-02-17 DIAGNOSIS — I2699 Other pulmonary embolism without acute cor pulmonale: Secondary | ICD-10-CM | POA: Diagnosis not present

## 2015-02-17 LAB — POCT INR: INR: 3

## 2015-02-25 ENCOUNTER — Encounter: Payer: Self-pay | Admitting: Cardiovascular Disease

## 2015-03-16 DIAGNOSIS — L97909 Non-pressure chronic ulcer of unspecified part of unspecified lower leg with unspecified severity: Secondary | ICD-10-CM | POA: Diagnosis not present

## 2015-03-16 DIAGNOSIS — I8310 Varicose veins of unspecified lower extremity with inflammation: Secondary | ICD-10-CM | POA: Diagnosis not present

## 2015-03-31 ENCOUNTER — Ambulatory Visit: Payer: Medicare Other | Admitting: Pharmacist Clinician (PhC)/ Clinical Pharmacy Specialist

## 2015-04-01 ENCOUNTER — Encounter (HOSPITAL_BASED_OUTPATIENT_CLINIC_OR_DEPARTMENT_OTHER): Payer: Medicare Other | Attending: Internal Medicine

## 2015-04-01 DIAGNOSIS — E1140 Type 2 diabetes mellitus with diabetic neuropathy, unspecified: Secondary | ICD-10-CM | POA: Insufficient documentation

## 2015-04-01 DIAGNOSIS — L97221 Non-pressure chronic ulcer of left calf limited to breakdown of skin: Secondary | ICD-10-CM | POA: Diagnosis not present

## 2015-04-01 DIAGNOSIS — M1990 Unspecified osteoarthritis, unspecified site: Secondary | ICD-10-CM | POA: Insufficient documentation

## 2015-04-01 DIAGNOSIS — I10 Essential (primary) hypertension: Secondary | ICD-10-CM | POA: Diagnosis not present

## 2015-04-01 DIAGNOSIS — L97211 Non-pressure chronic ulcer of right calf limited to breakdown of skin: Secondary | ICD-10-CM | POA: Diagnosis not present

## 2015-04-01 DIAGNOSIS — Z794 Long term (current) use of insulin: Secondary | ICD-10-CM | POA: Diagnosis not present

## 2015-04-01 DIAGNOSIS — G4733 Obstructive sleep apnea (adult) (pediatric): Secondary | ICD-10-CM | POA: Diagnosis not present

## 2015-04-01 DIAGNOSIS — N183 Chronic kidney disease, stage 3 (moderate): Secondary | ICD-10-CM | POA: Insufficient documentation

## 2015-04-01 DIAGNOSIS — M109 Gout, unspecified: Secondary | ICD-10-CM | POA: Diagnosis not present

## 2015-04-01 DIAGNOSIS — I87333 Chronic venous hypertension (idiopathic) with ulcer and inflammation of bilateral lower extremity: Secondary | ICD-10-CM | POA: Insufficient documentation

## 2015-04-07 ENCOUNTER — Ambulatory Visit (INDEPENDENT_AMBULATORY_CARE_PROVIDER_SITE_OTHER): Payer: Medicare Other | Admitting: Pharmacist Clinician (PhC)/ Clinical Pharmacy Specialist

## 2015-04-07 DIAGNOSIS — I2699 Other pulmonary embolism without acute cor pulmonale: Secondary | ICD-10-CM | POA: Diagnosis not present

## 2015-04-07 DIAGNOSIS — Z7901 Long term (current) use of anticoagulants: Secondary | ICD-10-CM

## 2015-04-07 LAB — POCT INR: INR: 2.3

## 2015-04-07 MED ORDER — WARFARIN SODIUM 5 MG PO TABS: ORAL_TABLET | ORAL | Status: DC

## 2015-04-08 DIAGNOSIS — L97221 Non-pressure chronic ulcer of left calf limited to breakdown of skin: Secondary | ICD-10-CM | POA: Diagnosis not present

## 2015-04-08 DIAGNOSIS — I10 Essential (primary) hypertension: Secondary | ICD-10-CM | POA: Diagnosis not present

## 2015-04-08 DIAGNOSIS — L97211 Non-pressure chronic ulcer of right calf limited to breakdown of skin: Secondary | ICD-10-CM | POA: Diagnosis not present

## 2015-04-08 DIAGNOSIS — I87333 Chronic venous hypertension (idiopathic) with ulcer and inflammation of bilateral lower extremity: Secondary | ICD-10-CM | POA: Diagnosis not present

## 2015-04-08 DIAGNOSIS — M1990 Unspecified osteoarthritis, unspecified site: Secondary | ICD-10-CM | POA: Diagnosis not present

## 2015-04-08 DIAGNOSIS — E1140 Type 2 diabetes mellitus with diabetic neuropathy, unspecified: Secondary | ICD-10-CM | POA: Diagnosis not present

## 2015-04-15 ENCOUNTER — Encounter (HOSPITAL_BASED_OUTPATIENT_CLINIC_OR_DEPARTMENT_OTHER): Payer: Medicare Other | Attending: Internal Medicine

## 2015-04-15 DIAGNOSIS — L97211 Non-pressure chronic ulcer of right calf limited to breakdown of skin: Secondary | ICD-10-CM | POA: Diagnosis not present

## 2015-04-15 DIAGNOSIS — I87333 Chronic venous hypertension (idiopathic) with ulcer and inflammation of bilateral lower extremity: Secondary | ICD-10-CM | POA: Insufficient documentation

## 2015-04-15 DIAGNOSIS — L97221 Non-pressure chronic ulcer of left calf limited to breakdown of skin: Secondary | ICD-10-CM | POA: Insufficient documentation

## 2015-04-21 DIAGNOSIS — R319 Hematuria, unspecified: Secondary | ICD-10-CM | POA: Diagnosis not present

## 2015-04-21 DIAGNOSIS — N183 Chronic kidney disease, stage 3 (moderate): Secondary | ICD-10-CM | POA: Diagnosis not present

## 2015-04-21 DIAGNOSIS — E1122 Type 2 diabetes mellitus with diabetic chronic kidney disease: Secondary | ICD-10-CM | POA: Diagnosis not present

## 2015-04-21 DIAGNOSIS — E1142 Type 2 diabetes mellitus with diabetic polyneuropathy: Secondary | ICD-10-CM | POA: Diagnosis not present

## 2015-04-21 DIAGNOSIS — Z794 Long term (current) use of insulin: Secondary | ICD-10-CM | POA: Diagnosis not present

## 2015-04-21 DIAGNOSIS — L570 Actinic keratosis: Secondary | ICD-10-CM | POA: Diagnosis not present

## 2015-04-21 DIAGNOSIS — Z0001 Encounter for general adult medical examination with abnormal findings: Secondary | ICD-10-CM | POA: Diagnosis not present

## 2015-04-21 DIAGNOSIS — N400 Enlarged prostate without lower urinary tract symptoms: Secondary | ICD-10-CM | POA: Diagnosis not present

## 2015-04-21 DIAGNOSIS — Z6837 Body mass index (BMI) 37.0-37.9, adult: Secondary | ICD-10-CM | POA: Diagnosis not present

## 2015-04-21 DIAGNOSIS — Z23 Encounter for immunization: Secondary | ICD-10-CM | POA: Diagnosis not present

## 2015-04-21 DIAGNOSIS — I10 Essential (primary) hypertension: Secondary | ICD-10-CM | POA: Diagnosis not present

## 2015-04-21 DIAGNOSIS — I2782 Chronic pulmonary embolism: Secondary | ICD-10-CM | POA: Diagnosis not present

## 2015-05-19 ENCOUNTER — Ambulatory Visit (INDEPENDENT_AMBULATORY_CARE_PROVIDER_SITE_OTHER): Payer: Medicare Other | Admitting: Pharmacist Clinician (PhC)/ Clinical Pharmacy Specialist

## 2015-05-19 DIAGNOSIS — Z7901 Long term (current) use of anticoagulants: Secondary | ICD-10-CM | POA: Diagnosis not present

## 2015-05-19 DIAGNOSIS — I2699 Other pulmonary embolism without acute cor pulmonale: Secondary | ICD-10-CM | POA: Diagnosis not present

## 2015-05-19 LAB — POCT INR: INR: 2.6

## 2015-06-18 DIAGNOSIS — N184 Chronic kidney disease, stage 4 (severe): Secondary | ICD-10-CM | POA: Diagnosis not present

## 2015-06-18 DIAGNOSIS — R319 Hematuria, unspecified: Secondary | ICD-10-CM | POA: Diagnosis not present

## 2015-06-18 DIAGNOSIS — E1142 Type 2 diabetes mellitus with diabetic polyneuropathy: Secondary | ICD-10-CM | POA: Diagnosis not present

## 2015-06-18 DIAGNOSIS — I10 Essential (primary) hypertension: Secondary | ICD-10-CM | POA: Diagnosis not present

## 2015-06-18 DIAGNOSIS — R809 Proteinuria, unspecified: Secondary | ICD-10-CM | POA: Diagnosis not present

## 2015-06-30 ENCOUNTER — Ambulatory Visit (INDEPENDENT_AMBULATORY_CARE_PROVIDER_SITE_OTHER): Payer: Medicare Other | Admitting: Pharmacist Clinician (PhC)/ Clinical Pharmacy Specialist

## 2015-06-30 DIAGNOSIS — Z7901 Long term (current) use of anticoagulants: Secondary | ICD-10-CM

## 2015-06-30 DIAGNOSIS — I2699 Other pulmonary embolism without acute cor pulmonale: Secondary | ICD-10-CM

## 2015-06-30 LAB — POCT INR: INR: 3

## 2015-08-11 ENCOUNTER — Ambulatory Visit (INDEPENDENT_AMBULATORY_CARE_PROVIDER_SITE_OTHER): Payer: Medicare Other | Admitting: Pharmacist Clinician (PhC)/ Clinical Pharmacy Specialist

## 2015-08-11 DIAGNOSIS — Z7901 Long term (current) use of anticoagulants: Secondary | ICD-10-CM | POA: Diagnosis not present

## 2015-08-11 DIAGNOSIS — I2699 Other pulmonary embolism without acute cor pulmonale: Secondary | ICD-10-CM | POA: Diagnosis not present

## 2015-08-11 LAB — POCT INR: INR: 2.6

## 2015-08-11 MED ORDER — WARFARIN SODIUM 5 MG PO TABS: ORAL_TABLET | ORAL | Status: DC

## 2015-08-11 NOTE — Addendum Note (Signed)
Addended by: Rockne Menghini on: 08/11/2015 01:39 PM   Modules accepted: Level of Service

## 2015-09-22 ENCOUNTER — Ambulatory Visit (INDEPENDENT_AMBULATORY_CARE_PROVIDER_SITE_OTHER): Payer: Medicare Other | Admitting: Pharmacist Clinician (PhC)/ Clinical Pharmacy Specialist

## 2015-09-22 DIAGNOSIS — I2699 Other pulmonary embolism without acute cor pulmonale: Secondary | ICD-10-CM

## 2015-09-22 DIAGNOSIS — Z7901 Long term (current) use of anticoagulants: Secondary | ICD-10-CM

## 2015-09-22 LAB — POCT INR: INR: 2

## 2015-11-02 ENCOUNTER — Ambulatory Visit (INDEPENDENT_AMBULATORY_CARE_PROVIDER_SITE_OTHER): Payer: Medicare Other | Admitting: Pharmacist Clinician (PhC)/ Clinical Pharmacy Specialist

## 2015-11-02 DIAGNOSIS — Z7901 Long term (current) use of anticoagulants: Secondary | ICD-10-CM | POA: Diagnosis not present

## 2015-11-02 DIAGNOSIS — I2699 Other pulmonary embolism without acute cor pulmonale: Secondary | ICD-10-CM

## 2015-11-02 LAB — POCT INR: INR: 3.4

## 2015-11-10 DIAGNOSIS — Z794 Long term (current) use of insulin: Secondary | ICD-10-CM | POA: Diagnosis not present

## 2015-11-10 DIAGNOSIS — Z6837 Body mass index (BMI) 37.0-37.9, adult: Secondary | ICD-10-CM | POA: Diagnosis not present

## 2015-11-10 DIAGNOSIS — E782 Mixed hyperlipidemia: Secondary | ICD-10-CM | POA: Diagnosis not present

## 2015-11-10 DIAGNOSIS — N184 Chronic kidney disease, stage 4 (severe): Secondary | ICD-10-CM | POA: Diagnosis not present

## 2015-11-10 DIAGNOSIS — E1142 Type 2 diabetes mellitus with diabetic polyneuropathy: Secondary | ICD-10-CM | POA: Diagnosis not present

## 2015-11-10 DIAGNOSIS — E1122 Type 2 diabetes mellitus with diabetic chronic kidney disease: Secondary | ICD-10-CM | POA: Diagnosis not present

## 2015-11-10 DIAGNOSIS — I10 Essential (primary) hypertension: Secondary | ICD-10-CM | POA: Diagnosis not present

## 2015-11-10 DIAGNOSIS — I2782 Chronic pulmonary embolism: Secondary | ICD-10-CM | POA: Diagnosis not present

## 2015-12-15 ENCOUNTER — Ambulatory Visit (INDEPENDENT_AMBULATORY_CARE_PROVIDER_SITE_OTHER): Payer: Medicare Other | Admitting: Pharmacist

## 2015-12-15 DIAGNOSIS — I2699 Other pulmonary embolism without acute cor pulmonale: Secondary | ICD-10-CM

## 2015-12-15 DIAGNOSIS — Z7901 Long term (current) use of anticoagulants: Secondary | ICD-10-CM

## 2015-12-15 LAB — POCT INR: INR: 2.4

## 2015-12-27 DIAGNOSIS — M109 Gout, unspecified: Secondary | ICD-10-CM | POA: Diagnosis not present

## 2015-12-27 DIAGNOSIS — R319 Hematuria, unspecified: Secondary | ICD-10-CM | POA: Diagnosis not present

## 2015-12-27 DIAGNOSIS — E1142 Type 2 diabetes mellitus with diabetic polyneuropathy: Secondary | ICD-10-CM | POA: Diagnosis not present

## 2015-12-27 DIAGNOSIS — N184 Chronic kidney disease, stage 4 (severe): Secondary | ICD-10-CM | POA: Diagnosis not present

## 2015-12-27 DIAGNOSIS — I10 Essential (primary) hypertension: Secondary | ICD-10-CM | POA: Diagnosis not present

## 2015-12-27 DIAGNOSIS — R809 Proteinuria, unspecified: Secondary | ICD-10-CM | POA: Diagnosis not present

## 2016-01-26 ENCOUNTER — Ambulatory Visit (INDEPENDENT_AMBULATORY_CARE_PROVIDER_SITE_OTHER): Payer: Medicare Other | Admitting: Pharmacist

## 2016-01-26 DIAGNOSIS — I2699 Other pulmonary embolism without acute cor pulmonale: Secondary | ICD-10-CM

## 2016-01-26 DIAGNOSIS — Z7901 Long term (current) use of anticoagulants: Secondary | ICD-10-CM | POA: Diagnosis not present

## 2016-01-26 LAB — POCT INR: INR: 1.7

## 2016-03-08 ENCOUNTER — Ambulatory Visit (INDEPENDENT_AMBULATORY_CARE_PROVIDER_SITE_OTHER): Payer: Medicare Other | Admitting: Pharmacist Clinician (PhC)/ Clinical Pharmacy Specialist

## 2016-03-08 DIAGNOSIS — I2699 Other pulmonary embolism without acute cor pulmonale: Secondary | ICD-10-CM

## 2016-03-08 DIAGNOSIS — Z7901 Long term (current) use of anticoagulants: Secondary | ICD-10-CM | POA: Diagnosis not present

## 2016-03-08 LAB — POCT INR: INR: 3.1

## 2016-04-19 ENCOUNTER — Ambulatory Visit (INDEPENDENT_AMBULATORY_CARE_PROVIDER_SITE_OTHER): Payer: Medicare Other | Admitting: Pharmacist

## 2016-04-19 DIAGNOSIS — I2699 Other pulmonary embolism without acute cor pulmonale: Secondary | ICD-10-CM | POA: Diagnosis not present

## 2016-04-19 DIAGNOSIS — Z7901 Long term (current) use of anticoagulants: Secondary | ICD-10-CM

## 2016-04-19 LAB — POCT INR: INR: 2.6

## 2016-05-31 ENCOUNTER — Ambulatory Visit (INDEPENDENT_AMBULATORY_CARE_PROVIDER_SITE_OTHER): Payer: Medicare Other | Admitting: Pharmacist Clinician (PhC)/ Clinical Pharmacy Specialist

## 2016-05-31 ENCOUNTER — Other Ambulatory Visit: Payer: Self-pay | Admitting: Cardiovascular Disease

## 2016-05-31 DIAGNOSIS — Z7901 Long term (current) use of anticoagulants: Secondary | ICD-10-CM | POA: Diagnosis not present

## 2016-05-31 DIAGNOSIS — I2699 Other pulmonary embolism without acute cor pulmonale: Secondary | ICD-10-CM | POA: Diagnosis not present

## 2016-05-31 LAB — POCT INR: INR: 3.2

## 2016-06-01 ENCOUNTER — Other Ambulatory Visit: Payer: Self-pay | Admitting: *Deleted

## 2016-06-01 MED ORDER — WARFARIN SODIUM 5 MG PO TABS: ORAL_TABLET | ORAL | 1 refills | Status: DC

## 2016-06-28 DIAGNOSIS — I10 Essential (primary) hypertension: Secondary | ICD-10-CM | POA: Diagnosis not present

## 2016-06-28 DIAGNOSIS — N184 Chronic kidney disease, stage 4 (severe): Secondary | ICD-10-CM | POA: Diagnosis not present

## 2016-06-28 DIAGNOSIS — M150 Primary generalized (osteo)arthritis: Secondary | ICD-10-CM | POA: Diagnosis not present

## 2016-06-28 DIAGNOSIS — E1142 Type 2 diabetes mellitus with diabetic polyneuropathy: Secondary | ICD-10-CM | POA: Diagnosis not present

## 2016-06-28 DIAGNOSIS — E1122 Type 2 diabetes mellitus with diabetic chronic kidney disease: Secondary | ICD-10-CM | POA: Diagnosis not present

## 2016-06-28 DIAGNOSIS — Z0000 Encounter for general adult medical examination without abnormal findings: Secondary | ICD-10-CM | POA: Diagnosis not present

## 2016-06-28 DIAGNOSIS — I2782 Chronic pulmonary embolism: Secondary | ICD-10-CM | POA: Diagnosis not present

## 2016-06-28 DIAGNOSIS — E782 Mixed hyperlipidemia: Secondary | ICD-10-CM | POA: Diagnosis not present

## 2016-06-28 DIAGNOSIS — I8310 Varicose veins of unspecified lower extremity with inflammation: Secondary | ICD-10-CM | POA: Diagnosis not present

## 2016-06-28 DIAGNOSIS — J309 Allergic rhinitis, unspecified: Secondary | ICD-10-CM | POA: Diagnosis not present

## 2016-07-12 ENCOUNTER — Ambulatory Visit (INDEPENDENT_AMBULATORY_CARE_PROVIDER_SITE_OTHER): Payer: Medicare Other | Admitting: Pharmacist

## 2016-07-12 DIAGNOSIS — Z7901 Long term (current) use of anticoagulants: Secondary | ICD-10-CM | POA: Diagnosis not present

## 2016-07-12 DIAGNOSIS — I2699 Other pulmonary embolism without acute cor pulmonale: Secondary | ICD-10-CM | POA: Diagnosis not present

## 2016-07-12 LAB — POCT INR: INR: 2.7

## 2016-08-23 ENCOUNTER — Ambulatory Visit (INDEPENDENT_AMBULATORY_CARE_PROVIDER_SITE_OTHER): Payer: Medicare Other | Admitting: Pharmacist Clinician (PhC)/ Clinical Pharmacy Specialist

## 2016-08-23 DIAGNOSIS — I2699 Other pulmonary embolism without acute cor pulmonale: Secondary | ICD-10-CM | POA: Diagnosis not present

## 2016-08-23 DIAGNOSIS — Z7901 Long term (current) use of anticoagulants: Secondary | ICD-10-CM

## 2016-08-23 LAB — POCT INR: INR: 4.1

## 2016-09-13 ENCOUNTER — Ambulatory Visit (INDEPENDENT_AMBULATORY_CARE_PROVIDER_SITE_OTHER): Payer: Medicare Other | Admitting: Pharmacist Clinician (PhC)/ Clinical Pharmacy Specialist

## 2016-09-13 DIAGNOSIS — I2699 Other pulmonary embolism without acute cor pulmonale: Secondary | ICD-10-CM

## 2016-09-13 DIAGNOSIS — Z7901 Long term (current) use of anticoagulants: Secondary | ICD-10-CM

## 2016-09-13 LAB — POCT INR: INR: 3.7

## 2016-10-04 ENCOUNTER — Ambulatory Visit (INDEPENDENT_AMBULATORY_CARE_PROVIDER_SITE_OTHER): Payer: Medicare Other | Admitting: Pharmacist

## 2016-10-04 DIAGNOSIS — I2699 Other pulmonary embolism without acute cor pulmonale: Secondary | ICD-10-CM | POA: Diagnosis not present

## 2016-10-04 DIAGNOSIS — Z7901 Long term (current) use of anticoagulants: Secondary | ICD-10-CM

## 2016-10-04 LAB — POCT INR: INR: 2.3

## 2016-11-15 ENCOUNTER — Ambulatory Visit (INDEPENDENT_AMBULATORY_CARE_PROVIDER_SITE_OTHER): Payer: Medicare Other | Admitting: Pharmacist Clinician (PhC)/ Clinical Pharmacy Specialist

## 2016-11-15 DIAGNOSIS — I2699 Other pulmonary embolism without acute cor pulmonale: Secondary | ICD-10-CM

## 2016-11-15 DIAGNOSIS — Z7901 Long term (current) use of anticoagulants: Secondary | ICD-10-CM | POA: Diagnosis not present

## 2016-11-15 LAB — POCT INR: INR: 2.6

## 2016-12-26 ENCOUNTER — Ambulatory Visit (INDEPENDENT_AMBULATORY_CARE_PROVIDER_SITE_OTHER): Payer: Medicare Other | Admitting: Pharmacist

## 2016-12-26 DIAGNOSIS — I2699 Other pulmonary embolism without acute cor pulmonale: Secondary | ICD-10-CM

## 2016-12-26 DIAGNOSIS — Z7901 Long term (current) use of anticoagulants: Secondary | ICD-10-CM

## 2016-12-26 LAB — POCT INR: INR: 2.8

## 2017-01-05 DIAGNOSIS — N179 Acute kidney failure, unspecified: Secondary | ICD-10-CM | POA: Diagnosis not present

## 2017-01-05 DIAGNOSIS — N184 Chronic kidney disease, stage 4 (severe): Secondary | ICD-10-CM | POA: Diagnosis not present

## 2017-01-12 DIAGNOSIS — N184 Chronic kidney disease, stage 4 (severe): Secondary | ICD-10-CM | POA: Diagnosis not present

## 2017-01-23 DIAGNOSIS — I10 Essential (primary) hypertension: Secondary | ICD-10-CM | POA: Diagnosis not present

## 2017-01-23 DIAGNOSIS — R809 Proteinuria, unspecified: Secondary | ICD-10-CM | POA: Diagnosis not present

## 2017-01-23 DIAGNOSIS — M109 Gout, unspecified: Secondary | ICD-10-CM | POA: Diagnosis not present

## 2017-01-23 DIAGNOSIS — N184 Chronic kidney disease, stage 4 (severe): Secondary | ICD-10-CM | POA: Diagnosis not present

## 2017-01-23 DIAGNOSIS — Z6835 Body mass index (BMI) 35.0-35.9, adult: Secondary | ICD-10-CM | POA: Diagnosis not present

## 2017-01-23 DIAGNOSIS — E1142 Type 2 diabetes mellitus with diabetic polyneuropathy: Secondary | ICD-10-CM | POA: Diagnosis not present

## 2017-01-23 DIAGNOSIS — R319 Hematuria, unspecified: Secondary | ICD-10-CM | POA: Diagnosis not present

## 2017-02-13 ENCOUNTER — Ambulatory Visit (INDEPENDENT_AMBULATORY_CARE_PROVIDER_SITE_OTHER): Payer: Medicare Other | Admitting: Pharmacist

## 2017-02-13 DIAGNOSIS — I2699 Other pulmonary embolism without acute cor pulmonale: Secondary | ICD-10-CM

## 2017-02-13 DIAGNOSIS — Z7901 Long term (current) use of anticoagulants: Secondary | ICD-10-CM | POA: Diagnosis not present

## 2017-02-13 LAB — POCT INR: INR: 3.6

## 2017-03-02 ENCOUNTER — Ambulatory Visit (INDEPENDENT_AMBULATORY_CARE_PROVIDER_SITE_OTHER): Payer: Medicare Other | Admitting: Pharmacist

## 2017-03-02 DIAGNOSIS — I2699 Other pulmonary embolism without acute cor pulmonale: Secondary | ICD-10-CM | POA: Diagnosis not present

## 2017-03-02 DIAGNOSIS — Z7901 Long term (current) use of anticoagulants: Secondary | ICD-10-CM | POA: Diagnosis not present

## 2017-03-02 LAB — POCT INR: INR: 3.4

## 2017-03-19 DIAGNOSIS — M109 Gout, unspecified: Secondary | ICD-10-CM | POA: Diagnosis not present

## 2017-03-19 DIAGNOSIS — E1142 Type 2 diabetes mellitus with diabetic polyneuropathy: Secondary | ICD-10-CM | POA: Diagnosis not present

## 2017-03-19 DIAGNOSIS — R319 Hematuria, unspecified: Secondary | ICD-10-CM | POA: Diagnosis not present

## 2017-03-19 DIAGNOSIS — Z6835 Body mass index (BMI) 35.0-35.9, adult: Secondary | ICD-10-CM | POA: Diagnosis not present

## 2017-03-19 DIAGNOSIS — R809 Proteinuria, unspecified: Secondary | ICD-10-CM | POA: Diagnosis not present

## 2017-03-19 DIAGNOSIS — I10 Essential (primary) hypertension: Secondary | ICD-10-CM | POA: Diagnosis not present

## 2017-03-19 DIAGNOSIS — N184 Chronic kidney disease, stage 4 (severe): Secondary | ICD-10-CM | POA: Diagnosis not present

## 2017-03-27 ENCOUNTER — Ambulatory Visit (INDEPENDENT_AMBULATORY_CARE_PROVIDER_SITE_OTHER): Payer: Medicare Other | Admitting: Pharmacist Clinician (PhC)/ Clinical Pharmacy Specialist

## 2017-03-27 DIAGNOSIS — Z7901 Long term (current) use of anticoagulants: Secondary | ICD-10-CM

## 2017-03-27 DIAGNOSIS — I2699 Other pulmonary embolism without acute cor pulmonale: Secondary | ICD-10-CM | POA: Diagnosis not present

## 2017-03-27 LAB — POCT INR: INR: 3.3

## 2017-04-24 ENCOUNTER — Ambulatory Visit (INDEPENDENT_AMBULATORY_CARE_PROVIDER_SITE_OTHER): Payer: Medicare Other | Admitting: Pharmacist Clinician (PhC)/ Clinical Pharmacy Specialist

## 2017-04-24 DIAGNOSIS — Z7901 Long term (current) use of anticoagulants: Secondary | ICD-10-CM

## 2017-04-24 DIAGNOSIS — I2699 Other pulmonary embolism without acute cor pulmonale: Secondary | ICD-10-CM

## 2017-04-24 LAB — POCT INR: INR: 3.8

## 2017-05-15 ENCOUNTER — Ambulatory Visit (INDEPENDENT_AMBULATORY_CARE_PROVIDER_SITE_OTHER): Payer: Medicare Other | Admitting: Pharmacist Clinician (PhC)/ Clinical Pharmacy Specialist

## 2017-05-15 DIAGNOSIS — Z7901 Long term (current) use of anticoagulants: Secondary | ICD-10-CM | POA: Diagnosis not present

## 2017-05-15 DIAGNOSIS — I2699 Other pulmonary embolism without acute cor pulmonale: Secondary | ICD-10-CM

## 2017-05-15 LAB — POCT INR: INR: 1.4

## 2017-05-17 DIAGNOSIS — Z6835 Body mass index (BMI) 35.0-35.9, adult: Secondary | ICD-10-CM | POA: Diagnosis not present

## 2017-05-17 DIAGNOSIS — I10 Essential (primary) hypertension: Secondary | ICD-10-CM | POA: Diagnosis not present

## 2017-05-17 DIAGNOSIS — R809 Proteinuria, unspecified: Secondary | ICD-10-CM | POA: Diagnosis not present

## 2017-05-17 DIAGNOSIS — N390 Urinary tract infection, site not specified: Secondary | ICD-10-CM | POA: Diagnosis not present

## 2017-05-17 DIAGNOSIS — R319 Hematuria, unspecified: Secondary | ICD-10-CM | POA: Diagnosis not present

## 2017-05-17 DIAGNOSIS — N184 Chronic kidney disease, stage 4 (severe): Secondary | ICD-10-CM | POA: Diagnosis not present

## 2017-05-17 DIAGNOSIS — E1142 Type 2 diabetes mellitus with diabetic polyneuropathy: Secondary | ICD-10-CM | POA: Diagnosis not present

## 2017-05-17 DIAGNOSIS — M109 Gout, unspecified: Secondary | ICD-10-CM | POA: Diagnosis not present

## 2017-05-17 DIAGNOSIS — Z23 Encounter for immunization: Secondary | ICD-10-CM | POA: Diagnosis not present

## 2017-06-05 ENCOUNTER — Ambulatory Visit (INDEPENDENT_AMBULATORY_CARE_PROVIDER_SITE_OTHER): Payer: Medicare Other | Admitting: Pharmacist Clinician (PhC)/ Clinical Pharmacy Specialist

## 2017-06-05 DIAGNOSIS — Z7901 Long term (current) use of anticoagulants: Secondary | ICD-10-CM

## 2017-06-05 DIAGNOSIS — I2699 Other pulmonary embolism without acute cor pulmonale: Secondary | ICD-10-CM | POA: Diagnosis not present

## 2017-06-05 LAB — POCT INR: INR: 2.4

## 2017-06-08 ENCOUNTER — Encounter: Payer: Self-pay | Admitting: Cardiovascular Disease

## 2017-06-08 ENCOUNTER — Ambulatory Visit (INDEPENDENT_AMBULATORY_CARE_PROVIDER_SITE_OTHER): Payer: Medicare Other | Admitting: Cardiovascular Disease

## 2017-06-08 VITALS — BP 160/73 | HR 69 | Ht 71.0 in | Wt 251.0 lb

## 2017-06-08 DIAGNOSIS — E785 Hyperlipidemia, unspecified: Secondary | ICD-10-CM | POA: Insufficient documentation

## 2017-06-08 DIAGNOSIS — E7800 Pure hypercholesterolemia, unspecified: Secondary | ICD-10-CM | POA: Diagnosis not present

## 2017-06-08 DIAGNOSIS — R0989 Other specified symptoms and signs involving the circulatory and respiratory systems: Secondary | ICD-10-CM | POA: Diagnosis not present

## 2017-06-08 DIAGNOSIS — I10 Essential (primary) hypertension: Secondary | ICD-10-CM | POA: Diagnosis not present

## 2017-06-08 NOTE — Assessment & Plan Note (Signed)
History of hyperlipidemia on statin therapy followed by PCP 

## 2017-06-08 NOTE — Patient Instructions (Signed)
Medication Instructions: Your physician recommends that you continue on your current medications as directed. Please refer to the Current Medication list given to you today.   Testing/Procedures: Your physician has requested that you have a carotid duplex. This test is an ultrasound of the carotid arteries in your neck. It looks at blood flow through these arteries that supply the brain with blood. Allow one hour for this exam. There are no restrictions or special instructions.  Follow-Up: Your physician recommends that you schedule a follow-up appointment as needed with Dr. Berry.     

## 2017-06-08 NOTE — Progress Notes (Signed)
     06/08/2017 Suzan Nailer   12/23/1932  [id number]  Primary Physician Mayra Neer, MD Primary Cardiologist: Lorretta Harp MD Lupe Carney, [place]  HPI:  Chase [name] is a 83 y.o. male married Caucasian male, mildly overweight, father of 33, grandfather 6 grandchildren who is referred by Dr. Brigitte Pulse for cardiovascular evaluation. He sees [facility] for ongoing assessment of INR is on Coumadin anticoagulation because of remote pulmonary emboli. He does have a history of true hypertension, hyperlipidemia and diabetes. He still had a heart attack or stroke. He had a normal cath in the past and a negative Myoview stress test 03/13/12. He denies chest pain or shortness of breath. He does have chronic renal insufficiency being taken care of by Dr. Moshe Cipro.    No outpatient prescriptions have been marked as taking for the 06/08/17 encounter (Office Visit) with Lorretta Harp, MD.     Allergies  Allergen Reactions  . Sulfa Antibiotics     Social History   Social History  . Marital status: Married    Spouse name: N/A  . Number of children: N/A  . Years of education: N/A   Occupational History  . Not on file.   Social History Main Topics  . Smoking status: Former Research scientist (life sciences)  . Smokeless tobacco: Never Used  . Alcohol use No  . Drug use: No  . Sexual activity: Not on file   Other Topics Concern  . Not on file   Social History Narrative  . No narrative on file     Review of Systems: General: negative for chills, fever, night sweats or weight changes.  Cardiovascular: negative for chest pain, dyspnea on exertion, edema, orthopnea, palpitations, paroxysmal nocturnal dyspnea or shortness of breath Dermatological: negative for rash Respiratory: negative for cough or wheezing Urologic: negative for hematuria Abdominal: negative for nausea, vomiting, diarrhea, bright red blood per rectum, melena, or hematemesis Neurologic: negative for visual changes, syncope, or  dizziness All other systems reviewed and are otherwise negative except as noted above.    Blood pressure (!) 160/73, pulse 69, height 5\' 11"  (1.803 m), weight 251 lb (113.9 kg).  General appearance: alert and no distress Neck: no adenopathy, no JVD, supple, symmetrical, trachea midline, thyroid not enlarged, symmetric, no tenderness/mass/nodules and Soft bilateral carotid bruits Lungs: clear to auscultation bilaterally Heart: regular rate and rhythm, S1, S2 normal, no murmur, click, rub or gallop Extremities: extremities normal, atraumatic, no cyanosis or edema Pulses: 2+ and symmetric Skin: Skin color, texture, turgor normal. No rashes or lesions Neurologic: Alert and oriented X 3, normal strength and tone. Normal symmetric reflexes. Normal coordination and gait  EKG sinus rhythm at 69 with right bundle-branch block. Personally reviewed this EKG.  ASSESSMENT AND PLAN:   Essential hypertension History of essential hypertension with blood pressure measures 160/73 although blood pressures in the past and a much lower than this. He is on amlodipine and Lotensin as well as metoprolol. Continue current meds at current dosing  Hyperlipidemia History of hyperlipidemia on statin therapy followed by PCP      Lorretta Harp MD [facility], [facility] 06/08/2017 12:36 PM

## 2017-06-08 NOTE — Assessment & Plan Note (Signed)
History of essential hypertension with blood pressure measures 160/73 although blood pressures in the past and a much lower than this. He is on amlodipine and Lotensin as well as metoprolol. Continue current meds at current dosing

## 2017-07-02 ENCOUNTER — Ambulatory Visit (HOSPITAL_COMMUNITY)
Admission: RE | Admit: 2017-07-02 | Discharge: 2017-07-02 | Disposition: A | Discharge status: Home / Self Care | Payer: Medicare Other | Source: Ambulatory Visit | Attending: Internal Medicine | Admitting: Internal Medicine

## 2017-07-02 DIAGNOSIS — I6523 Occlusion and stenosis of bilateral carotid arteries: Secondary | ICD-10-CM | POA: Insufficient documentation

## 2017-07-02 DIAGNOSIS — R0989 Other specified symptoms and signs involving the circulatory and respiratory systems: Secondary | ICD-10-CM | POA: Diagnosis not present

## 2017-07-04 DIAGNOSIS — E1142 Type 2 diabetes mellitus with diabetic polyneuropathy: Secondary | ICD-10-CM | POA: Diagnosis not present

## 2017-07-04 DIAGNOSIS — Z0000 Encounter for general adult medical examination without abnormal findings: Secondary | ICD-10-CM | POA: Diagnosis not present

## 2017-07-04 DIAGNOSIS — E782 Mixed hyperlipidemia: Secondary | ICD-10-CM | POA: Diagnosis not present

## 2017-07-04 DIAGNOSIS — N400 Enlarged prostate without lower urinary tract symptoms: Secondary | ICD-10-CM | POA: Diagnosis not present

## 2017-07-04 DIAGNOSIS — R319 Hematuria, unspecified: Secondary | ICD-10-CM | POA: Diagnosis not present

## 2017-07-04 DIAGNOSIS — M150 Primary generalized (osteo)arthritis: Secondary | ICD-10-CM | POA: Diagnosis not present

## 2017-07-04 DIAGNOSIS — I2782 Chronic pulmonary embolism: Secondary | ICD-10-CM | POA: Diagnosis not present

## 2017-07-04 DIAGNOSIS — I8310 Varicose veins of unspecified lower extremity with inflammation: Secondary | ICD-10-CM | POA: Diagnosis not present

## 2017-07-04 DIAGNOSIS — N184 Chronic kidney disease, stage 4 (severe): Secondary | ICD-10-CM | POA: Diagnosis not present

## 2017-07-04 DIAGNOSIS — I10 Essential (primary) hypertension: Secondary | ICD-10-CM | POA: Diagnosis not present

## 2017-07-04 DIAGNOSIS — E1122 Type 2 diabetes mellitus with diabetic chronic kidney disease: Secondary | ICD-10-CM | POA: Diagnosis not present

## 2017-07-17 ENCOUNTER — Ambulatory Visit (INDEPENDENT_AMBULATORY_CARE_PROVIDER_SITE_OTHER): Payer: Medicare Other | Admitting: Pharmacist Clinician (PhC)/ Clinical Pharmacy Specialist

## 2017-07-17 DIAGNOSIS — Z7901 Long term (current) use of anticoagulants: Secondary | ICD-10-CM

## 2017-07-17 DIAGNOSIS — I2699 Other pulmonary embolism without acute cor pulmonale: Secondary | ICD-10-CM | POA: Diagnosis not present

## 2017-07-17 LAB — POCT INR: INR: 1.8

## 2017-07-30 DIAGNOSIS — M109 Gout, unspecified: Secondary | ICD-10-CM | POA: Diagnosis not present

## 2017-07-30 DIAGNOSIS — I159 Secondary hypertension, unspecified: Secondary | ICD-10-CM | POA: Diagnosis not present

## 2017-07-30 DIAGNOSIS — M10459 Other secondary gout, unspecified hip: Secondary | ICD-10-CM | POA: Diagnosis not present

## 2017-07-30 DIAGNOSIS — I129 Hypertensive chronic kidney disease with stage 1 through stage 4 chronic kidney disease, or unspecified chronic kidney disease: Secondary | ICD-10-CM | POA: Diagnosis not present

## 2017-07-30 DIAGNOSIS — Z6835 Body mass index (BMI) 35.0-35.9, adult: Secondary | ICD-10-CM | POA: Diagnosis not present

## 2017-07-30 DIAGNOSIS — R809 Proteinuria, unspecified: Secondary | ICD-10-CM | POA: Diagnosis not present

## 2017-07-30 DIAGNOSIS — R319 Hematuria, unspecified: Secondary | ICD-10-CM | POA: Diagnosis not present

## 2017-07-30 DIAGNOSIS — Z23 Encounter for immunization: Secondary | ICD-10-CM | POA: Diagnosis not present

## 2017-07-30 DIAGNOSIS — D631 Anemia in chronic kidney disease: Secondary | ICD-10-CM | POA: Diagnosis not present

## 2017-07-30 DIAGNOSIS — I10 Essential (primary) hypertension: Secondary | ICD-10-CM | POA: Diagnosis not present

## 2017-07-30 DIAGNOSIS — N2581 Secondary hyperparathyroidism of renal origin: Secondary | ICD-10-CM | POA: Diagnosis not present

## 2017-07-30 DIAGNOSIS — N184 Chronic kidney disease, stage 4 (severe): Secondary | ICD-10-CM | POA: Diagnosis not present

## 2017-07-30 DIAGNOSIS — E1142 Type 2 diabetes mellitus with diabetic polyneuropathy: Secondary | ICD-10-CM | POA: Diagnosis not present

## 2017-08-15 ENCOUNTER — Other Ambulatory Visit: Payer: Self-pay | Admitting: Cardiovascular Disease

## 2017-08-17 ENCOUNTER — Ambulatory Visit (INDEPENDENT_AMBULATORY_CARE_PROVIDER_SITE_OTHER): Payer: Medicare Other | Admitting: Pharmacist

## 2017-08-17 DIAGNOSIS — I2699 Other pulmonary embolism without acute cor pulmonale: Secondary | ICD-10-CM | POA: Diagnosis not present

## 2017-08-17 DIAGNOSIS — Z7901 Long term (current) use of anticoagulants: Secondary | ICD-10-CM | POA: Diagnosis not present

## 2017-08-17 LAB — POCT INR: INR: 2.7

## 2017-09-20 DIAGNOSIS — I129 Hypertensive chronic kidney disease with stage 1 through stage 4 chronic kidney disease, or unspecified chronic kidney disease: Secondary | ICD-10-CM | POA: Diagnosis not present

## 2017-09-20 DIAGNOSIS — N184 Chronic kidney disease, stage 4 (severe): Secondary | ICD-10-CM | POA: Diagnosis not present

## 2017-09-20 DIAGNOSIS — E1142 Type 2 diabetes mellitus with diabetic polyneuropathy: Secondary | ICD-10-CM | POA: Diagnosis not present

## 2017-09-20 DIAGNOSIS — R319 Hematuria, unspecified: Secondary | ICD-10-CM | POA: Diagnosis not present

## 2017-09-20 DIAGNOSIS — Z6835 Body mass index (BMI) 35.0-35.9, adult: Secondary | ICD-10-CM | POA: Diagnosis not present

## 2017-09-20 DIAGNOSIS — R809 Proteinuria, unspecified: Secondary | ICD-10-CM | POA: Diagnosis not present

## 2017-09-20 DIAGNOSIS — M109 Gout, unspecified: Secondary | ICD-10-CM | POA: Diagnosis not present

## 2017-09-25 ENCOUNTER — Ambulatory Visit (INDEPENDENT_AMBULATORY_CARE_PROVIDER_SITE_OTHER): Payer: Medicare Other | Admitting: Pharmacist Clinician (PhC)/ Clinical Pharmacy Specialist

## 2017-09-25 DIAGNOSIS — Z7901 Long term (current) use of anticoagulants: Secondary | ICD-10-CM | POA: Diagnosis not present

## 2017-09-25 DIAGNOSIS — I2699 Other pulmonary embolism without acute cor pulmonale: Secondary | ICD-10-CM

## 2017-09-25 LAB — POCT INR: INR: 2.9

## 2017-09-25 NOTE — Patient Instructions (Signed)
Description   Continue taking 1 tablet daily except 1.5 tablets each Tuesday.  Repeat INR in 6 weeks.

## 2017-10-04 ENCOUNTER — Other Ambulatory Visit: Payer: Self-pay | Admitting: Advanced Practice Midwife

## 2017-10-04 DIAGNOSIS — N184 Chronic kidney disease, stage 4 (severe): Secondary | ICD-10-CM

## 2017-10-04 DIAGNOSIS — E782 Mixed hyperlipidemia: Secondary | ICD-10-CM | POA: Diagnosis not present

## 2017-10-04 DIAGNOSIS — Z6836 Body mass index (BMI) 36.0-36.9, adult: Secondary | ICD-10-CM | POA: Diagnosis not present

## 2017-10-04 DIAGNOSIS — I10 Essential (primary) hypertension: Secondary | ICD-10-CM | POA: Diagnosis not present

## 2017-10-04 DIAGNOSIS — Z794 Long term (current) use of insulin: Secondary | ICD-10-CM | POA: Diagnosis not present

## 2017-10-04 DIAGNOSIS — Z7984 Long term (current) use of oral hypoglycemic drugs: Secondary | ICD-10-CM | POA: Diagnosis not present

## 2017-10-04 DIAGNOSIS — E1142 Type 2 diabetes mellitus with diabetic polyneuropathy: Secondary | ICD-10-CM | POA: Diagnosis not present

## 2017-10-04 DIAGNOSIS — I2782 Chronic pulmonary embolism: Secondary | ICD-10-CM | POA: Diagnosis not present

## 2017-10-04 DIAGNOSIS — M109 Gout, unspecified: Secondary | ICD-10-CM | POA: Diagnosis not present

## 2017-10-09 ENCOUNTER — Other Ambulatory Visit: Payer: Medicare Other

## 2017-11-06 ENCOUNTER — Ambulatory Visit (INDEPENDENT_AMBULATORY_CARE_PROVIDER_SITE_OTHER): Payer: Medicare Other | Admitting: Pharmacist Clinician (PhC)/ Clinical Pharmacy Specialist

## 2017-11-06 DIAGNOSIS — Z7901 Long term (current) use of anticoagulants: Secondary | ICD-10-CM

## 2017-11-06 DIAGNOSIS — I2699 Other pulmonary embolism without acute cor pulmonale: Secondary | ICD-10-CM | POA: Diagnosis not present

## 2017-11-06 LAB — POCT INR: INR: 2.2

## 2017-11-19 ENCOUNTER — Ambulatory Visit
Admission: RE | Admit: 2017-11-19 | Discharge: 2017-11-19 | Disposition: A | Discharge status: Home / Self Care | Payer: Medicare Other | Source: Ambulatory Visit | Attending: Advanced Practice Midwife | Admitting: Advanced Practice Midwife

## 2017-11-19 DIAGNOSIS — N184 Chronic kidney disease, stage 4 (severe): Secondary | ICD-10-CM | POA: Diagnosis not present

## 2017-12-14 DIAGNOSIS — N184 Chronic kidney disease, stage 4 (severe): Secondary | ICD-10-CM | POA: Diagnosis not present

## 2017-12-14 DIAGNOSIS — Z6835 Body mass index (BMI) 35.0-35.9, adult: Secondary | ICD-10-CM | POA: Diagnosis not present

## 2017-12-14 DIAGNOSIS — R319 Hematuria, unspecified: Secondary | ICD-10-CM | POA: Diagnosis not present

## 2017-12-14 DIAGNOSIS — R809 Proteinuria, unspecified: Secondary | ICD-10-CM | POA: Diagnosis not present

## 2017-12-14 DIAGNOSIS — M109 Gout, unspecified: Secondary | ICD-10-CM | POA: Diagnosis not present

## 2017-12-14 DIAGNOSIS — I129 Hypertensive chronic kidney disease with stage 1 through stage 4 chronic kidney disease, or unspecified chronic kidney disease: Secondary | ICD-10-CM | POA: Diagnosis not present

## 2017-12-14 DIAGNOSIS — E1142 Type 2 diabetes mellitus with diabetic polyneuropathy: Secondary | ICD-10-CM | POA: Diagnosis not present

## 2017-12-18 ENCOUNTER — Ambulatory Visit (INDEPENDENT_AMBULATORY_CARE_PROVIDER_SITE_OTHER): Payer: Medicare Other | Admitting: Pharmacist

## 2017-12-18 DIAGNOSIS — Z7901 Long term (current) use of anticoagulants: Secondary | ICD-10-CM | POA: Diagnosis not present

## 2017-12-18 DIAGNOSIS — I2699 Other pulmonary embolism without acute cor pulmonale: Secondary | ICD-10-CM | POA: Diagnosis not present

## 2017-12-18 LAB — POCT INR: INR: 2.1

## 2017-12-19 ENCOUNTER — Other Ambulatory Visit: Payer: Self-pay

## 2017-12-19 DIAGNOSIS — N185 Chronic kidney disease, stage 5: Secondary | ICD-10-CM

## 2018-01-03 DIAGNOSIS — Z6835 Body mass index (BMI) 35.0-35.9, adult: Secondary | ICD-10-CM | POA: Diagnosis not present

## 2018-01-03 DIAGNOSIS — Z794 Long term (current) use of insulin: Secondary | ICD-10-CM | POA: Diagnosis not present

## 2018-01-03 DIAGNOSIS — I2782 Chronic pulmonary embolism: Secondary | ICD-10-CM | POA: Diagnosis not present

## 2018-01-03 DIAGNOSIS — I10 Essential (primary) hypertension: Secondary | ICD-10-CM | POA: Diagnosis not present

## 2018-01-03 DIAGNOSIS — E782 Mixed hyperlipidemia: Secondary | ICD-10-CM | POA: Diagnosis not present

## 2018-01-03 DIAGNOSIS — E1122 Type 2 diabetes mellitus with diabetic chronic kidney disease: Secondary | ICD-10-CM | POA: Diagnosis not present

## 2018-01-03 DIAGNOSIS — N184 Chronic kidney disease, stage 4 (severe): Secondary | ICD-10-CM | POA: Diagnosis not present

## 2018-01-03 DIAGNOSIS — Z7984 Long term (current) use of oral hypoglycemic drugs: Secondary | ICD-10-CM | POA: Diagnosis not present

## 2018-01-14 ENCOUNTER — Encounter (HOSPITAL_COMMUNITY): Payer: Medicare Other

## 2018-01-14 ENCOUNTER — Other Ambulatory Visit (HOSPITAL_COMMUNITY): Payer: Medicare Other

## 2018-01-14 ENCOUNTER — Encounter: Payer: Medicare Other | Admitting: Surgery

## 2018-01-29 ENCOUNTER — Ambulatory Visit (INDEPENDENT_AMBULATORY_CARE_PROVIDER_SITE_OTHER): Payer: Medicare Other | Admitting: Pharmacist

## 2018-01-29 DIAGNOSIS — Z7901 Long term (current) use of anticoagulants: Secondary | ICD-10-CM

## 2018-01-29 DIAGNOSIS — I2699 Other pulmonary embolism without acute cor pulmonale: Secondary | ICD-10-CM | POA: Diagnosis not present

## 2018-01-29 LAB — POCT INR: INR: 3.9 — AB (ref 2.0–3.0)

## 2018-02-22 ENCOUNTER — Encounter: Payer: Medicare Other | Admitting: Vascular Surgery

## 2018-02-22 ENCOUNTER — Other Ambulatory Visit (HOSPITAL_COMMUNITY): Payer: Medicare Other

## 2018-02-22 ENCOUNTER — Encounter (HOSPITAL_COMMUNITY): Payer: Medicare Other

## 2018-03-05 ENCOUNTER — Ambulatory Visit (INDEPENDENT_AMBULATORY_CARE_PROVIDER_SITE_OTHER): Payer: Medicare Other | Admitting: Pharmacist Clinician (PhC)/ Clinical Pharmacy Specialist

## 2018-03-05 DIAGNOSIS — I2699 Other pulmonary embolism without acute cor pulmonale: Secondary | ICD-10-CM

## 2018-03-05 DIAGNOSIS — Z7901 Long term (current) use of anticoagulants: Secondary | ICD-10-CM | POA: Diagnosis not present

## 2018-03-05 LAB — POCT INR: INR: 2.1 (ref 2.0–3.0)

## 2018-04-17 ENCOUNTER — Ambulatory Visit (INDEPENDENT_AMBULATORY_CARE_PROVIDER_SITE_OTHER): Payer: Medicare Other | Admitting: Pharmacist Clinician (PhC)/ Clinical Pharmacy Specialist

## 2018-04-17 DIAGNOSIS — I2699 Other pulmonary embolism without acute cor pulmonale: Secondary | ICD-10-CM | POA: Diagnosis not present

## 2018-04-17 DIAGNOSIS — Z7901 Long term (current) use of anticoagulants: Secondary | ICD-10-CM

## 2018-04-17 LAB — POCT INR: INR: 3.4 — AB (ref 2.0–3.0)

## 2018-04-18 DIAGNOSIS — I8310 Varicose veins of unspecified lower extremity with inflammation: Secondary | ICD-10-CM | POA: Diagnosis not present

## 2018-04-18 DIAGNOSIS — E782 Mixed hyperlipidemia: Secondary | ICD-10-CM | POA: Diagnosis not present

## 2018-04-18 DIAGNOSIS — I10 Essential (primary) hypertension: Secondary | ICD-10-CM | POA: Diagnosis not present

## 2018-04-18 DIAGNOSIS — M109 Gout, unspecified: Secondary | ICD-10-CM | POA: Diagnosis not present

## 2018-04-18 DIAGNOSIS — N184 Chronic kidney disease, stage 4 (severe): Secondary | ICD-10-CM | POA: Diagnosis not present

## 2018-04-18 DIAGNOSIS — I2782 Chronic pulmonary embolism: Secondary | ICD-10-CM | POA: Diagnosis not present

## 2018-04-18 DIAGNOSIS — E1142 Type 2 diabetes mellitus with diabetic polyneuropathy: Secondary | ICD-10-CM | POA: Diagnosis not present

## 2018-04-18 DIAGNOSIS — Z6836 Body mass index (BMI) 36.0-36.9, adult: Secondary | ICD-10-CM | POA: Diagnosis not present

## 2018-04-18 DIAGNOSIS — Z794 Long term (current) use of insulin: Secondary | ICD-10-CM | POA: Diagnosis not present

## 2018-04-18 DIAGNOSIS — Z23 Encounter for immunization: Secondary | ICD-10-CM | POA: Diagnosis not present

## 2018-05-15 ENCOUNTER — Ambulatory Visit (INDEPENDENT_AMBULATORY_CARE_PROVIDER_SITE_OTHER): Payer: Medicare Other | Admitting: Pharmacist

## 2018-05-15 DIAGNOSIS — I2699 Other pulmonary embolism without acute cor pulmonale: Secondary | ICD-10-CM

## 2018-05-15 DIAGNOSIS — Z7901 Long term (current) use of anticoagulants: Secondary | ICD-10-CM

## 2018-05-15 LAB — POCT INR: INR: 3.3 — AB (ref 2.0–3.0)

## 2018-05-21 ENCOUNTER — Other Ambulatory Visit: Payer: Self-pay | Admitting: Cardiovascular Disease

## 2018-05-24 ENCOUNTER — Encounter (HOSPITAL_COMMUNITY): Payer: Medicare Other

## 2018-05-24 ENCOUNTER — Other Ambulatory Visit (HOSPITAL_COMMUNITY): Payer: Medicare Other

## 2018-05-24 ENCOUNTER — Encounter: Payer: Medicare Other | Admitting: Vascular Surgery

## 2018-06-11 ENCOUNTER — Ambulatory Visit (INDEPENDENT_AMBULATORY_CARE_PROVIDER_SITE_OTHER): Payer: Medicare Other | Admitting: Pharmacist

## 2018-06-11 DIAGNOSIS — Z7901 Long term (current) use of anticoagulants: Secondary | ICD-10-CM | POA: Diagnosis not present

## 2018-06-11 LAB — POCT INR: INR: 2.7 (ref 2.0–3.0)

## 2018-07-18 DIAGNOSIS — E782 Mixed hyperlipidemia: Secondary | ICD-10-CM | POA: Diagnosis not present

## 2018-07-18 DIAGNOSIS — M109 Gout, unspecified: Secondary | ICD-10-CM | POA: Diagnosis not present

## 2018-07-18 DIAGNOSIS — Z0000 Encounter for general adult medical examination without abnormal findings: Secondary | ICD-10-CM | POA: Diagnosis not present

## 2018-07-18 DIAGNOSIS — E1142 Type 2 diabetes mellitus with diabetic polyneuropathy: Secondary | ICD-10-CM | POA: Diagnosis not present

## 2018-07-18 DIAGNOSIS — I8310 Varicose veins of unspecified lower extremity with inflammation: Secondary | ICD-10-CM | POA: Diagnosis not present

## 2018-07-18 DIAGNOSIS — J309 Allergic rhinitis, unspecified: Secondary | ICD-10-CM | POA: Diagnosis not present

## 2018-07-18 DIAGNOSIS — M150 Primary generalized (osteo)arthritis: Secondary | ICD-10-CM | POA: Diagnosis not present

## 2018-07-18 DIAGNOSIS — I10 Essential (primary) hypertension: Secondary | ICD-10-CM | POA: Diagnosis not present

## 2018-07-18 DIAGNOSIS — N400 Enlarged prostate without lower urinary tract symptoms: Secondary | ICD-10-CM | POA: Diagnosis not present

## 2018-07-18 DIAGNOSIS — I2782 Chronic pulmonary embolism: Secondary | ICD-10-CM | POA: Diagnosis not present

## 2018-07-18 DIAGNOSIS — N184 Chronic kidney disease, stage 4 (severe): Secondary | ICD-10-CM | POA: Diagnosis not present

## 2018-07-23 ENCOUNTER — Ambulatory Visit (INDEPENDENT_AMBULATORY_CARE_PROVIDER_SITE_OTHER): Payer: Medicare Other | Admitting: Pharmacist Clinician (PhC)/ Clinical Pharmacy Specialist

## 2018-07-23 DIAGNOSIS — Z7901 Long term (current) use of anticoagulants: Secondary | ICD-10-CM

## 2018-07-23 LAB — POCT INR: INR: 2.4 (ref 2.0–3.0)

## 2018-09-03 ENCOUNTER — Ambulatory Visit (INDEPENDENT_AMBULATORY_CARE_PROVIDER_SITE_OTHER): Payer: Medicare Other | Admitting: Pharmacist

## 2018-09-03 DIAGNOSIS — Z7901 Long term (current) use of anticoagulants: Secondary | ICD-10-CM | POA: Diagnosis not present

## 2018-09-03 DIAGNOSIS — I2699 Other pulmonary embolism without acute cor pulmonale: Secondary | ICD-10-CM

## 2018-09-03 LAB — POCT INR: INR: 2.6 (ref 2.0–3.0)

## 2018-10-15 ENCOUNTER — Ambulatory Visit (INDEPENDENT_AMBULATORY_CARE_PROVIDER_SITE_OTHER): Payer: Medicare Other | Admitting: Pharmacist

## 2018-10-15 DIAGNOSIS — Z7901 Long term (current) use of anticoagulants: Secondary | ICD-10-CM

## 2018-10-15 DIAGNOSIS — I2699 Other pulmonary embolism without acute cor pulmonale: Secondary | ICD-10-CM

## 2018-10-15 LAB — POCT INR: INR: 2.4 (ref 2.0–3.0)

## 2018-10-22 ENCOUNTER — Other Ambulatory Visit: Payer: Self-pay | Admitting: Family Medicine

## 2018-10-22 ENCOUNTER — Ambulatory Visit
Admission: RE | Admit: 2018-10-22 | Discharge: 2018-10-22 | Disposition: A | Discharge status: Home / Self Care | Payer: Medicare Other | Source: Ambulatory Visit | Attending: Family Medicine | Admitting: Family Medicine

## 2018-10-22 DIAGNOSIS — E782 Mixed hyperlipidemia: Secondary | ICD-10-CM | POA: Diagnosis not present

## 2018-10-22 DIAGNOSIS — R011 Cardiac murmur, unspecified: Secondary | ICD-10-CM

## 2018-10-22 DIAGNOSIS — I2782 Chronic pulmonary embolism: Secondary | ICD-10-CM | POA: Diagnosis not present

## 2018-10-22 DIAGNOSIS — J9811 Atelectasis: Secondary | ICD-10-CM | POA: Diagnosis not present

## 2018-10-22 DIAGNOSIS — N184 Chronic kidney disease, stage 4 (severe): Secondary | ICD-10-CM | POA: Diagnosis not present

## 2018-10-22 DIAGNOSIS — Z6836 Body mass index (BMI) 36.0-36.9, adult: Secondary | ICD-10-CM | POA: Diagnosis not present

## 2018-10-22 DIAGNOSIS — E1165 Type 2 diabetes mellitus with hyperglycemia: Secondary | ICD-10-CM | POA: Diagnosis not present

## 2018-10-22 DIAGNOSIS — I10 Essential (primary) hypertension: Secondary | ICD-10-CM | POA: Diagnosis not present

## 2018-10-22 DIAGNOSIS — I509 Heart failure, unspecified: Secondary | ICD-10-CM | POA: Diagnosis not present

## 2018-10-22 DIAGNOSIS — J90 Pleural effusion, not elsewhere classified: Secondary | ICD-10-CM | POA: Diagnosis not present

## 2018-10-22 DIAGNOSIS — Z794 Long term (current) use of insulin: Secondary | ICD-10-CM | POA: Diagnosis not present

## 2018-11-11 ENCOUNTER — Telehealth: Payer: Self-pay

## 2018-11-11 NOTE — Telephone Encounter (Signed)
Called both pt numbers on file multiple times. Unable to reach pt at home number and unable to leave a voicemail. Someone eventually answered call on mobile number and nurse requested to speak with pt. The person stated 'he's not in' and disconnected the call. Called mobile number again and attempted to leave a message with the person who answered the call. The person again stated 'he's not in' and disconnected the call. Attempted to call mobile number and call immediately disconnected. Unable to leave a voicemail d/t voice mailbox not being set up yet

## 2018-11-11 NOTE — Telephone Encounter (Signed)
Contacted pt at home and mobile numbers on file. Called home number x2 with busy signal and mobile number x3 with busy signal and message stating that pt voicemail has not yet been set up

## 2018-11-12 ENCOUNTER — Ambulatory Visit (INDEPENDENT_AMBULATORY_CARE_PROVIDER_SITE_OTHER): Payer: Medicare Other | Admitting: Cardiovascular Disease

## 2018-11-12 ENCOUNTER — Encounter: Payer: Self-pay | Admitting: Cardiovascular Disease

## 2018-11-12 ENCOUNTER — Other Ambulatory Visit: Payer: Self-pay

## 2018-11-12 DIAGNOSIS — I2699 Other pulmonary embolism without acute cor pulmonale: Secondary | ICD-10-CM

## 2018-11-12 DIAGNOSIS — I10 Essential (primary) hypertension: Secondary | ICD-10-CM | POA: Diagnosis not present

## 2018-11-12 DIAGNOSIS — N184 Chronic kidney disease, stage 4 (severe): Secondary | ICD-10-CM

## 2018-11-12 DIAGNOSIS — I350 Nonrheumatic aortic (valve) stenosis: Secondary | ICD-10-CM | POA: Insufficient documentation

## 2018-11-12 DIAGNOSIS — R011 Cardiac murmur, unspecified: Secondary | ICD-10-CM

## 2018-11-12 DIAGNOSIS — E782 Mixed hyperlipidemia: Secondary | ICD-10-CM

## 2018-11-12 DIAGNOSIS — R600 Localized edema: Secondary | ICD-10-CM

## 2018-11-12 NOTE — Assessment & Plan Note (Signed)
History of hyperlipidemia not on statin therapy with lipid profile performed 10/22/2018 revealing total cholesterol 148, LDL of 83 and HDL 43.

## 2018-11-12 NOTE — Progress Notes (Addendum)
11/12/2018 Suzan Nailer   06/29/1933  [id number]  Primary Physician Mayra Neer, MD Primary Cardiologist: Lorretta Harp MD Lupe Carney, [place]  HPI:  AZAEL RAGAIN is a 85 y.o.   married Caucasian male, mildly overweight, father of 55, grandfather 6 grandchildren who is referred by Dr. Brigitte Pulse for cardiovascular evaluation.  I last saw him in the office 06/08/2017.  I apparently have been taking care of him for the last 29 years since the early 90s.  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  He is accompanied by his wife Silva Bandy today as well.  He sees Kristen in our office for ongoing assessment of INR is on Coumadin anticoagulation because of remote pulmonary emboli. He does have a history of essential hypertension, hyperlipidemia and diabetes. He has never had a heart attack or stroke. He had a normal cath in the past and a negative Myoview stress test 03/13/12. He denies chest pain or shortness of breath. He does have chronic renal insufficiency, stage IV, being taken care of by Dr. Moshe Cipro.   Serum creatinines run in the low 4 range and apparently he does not wish to pursue hemodialysis.  He saw Dr. Brigitte Pulse in the office 2 weeks ago at which time she noticed a cardiac murmur and significant lower extremity edema.  He was on chlorthalidone.  She changed him to furosemide 40 mg p.o. twice daily which is resulted in a 13 pound weight loss and resolution of his edema.    Current Meds  Medication Sig  . amLODipine (NORVASC) 10 MG tablet Take 10 mg by mouth daily.  . benazepril (LOTENSIN) 40 MG tablet Take 40 mg by mouth daily.  . finasteride (PROSCAR) 5 MG tablet Take 5 mg by mouth daily.  . indomethacin (INDOCIN) 25 MG capsule Take 25 mg by mouth 2 (two) times daily as needed. Gout pain.  Marland Kitchen insulin glargine (LANTUS) 100 UNIT/ML injection Inject 35-40 Units into the skin daily. 35-40 units each morning  . metoprolol (LOPRESSOR) 50 MG tablet Take  100 mg by mouth 2 (two) times daily.   . Tamsulosin HCl (FLOMAX) 0.4 MG CAPS Take 0.4 mg by mouth daily after supper.  . warfarin (COUMADIN) 5 MG tablet Take 1 &1/2 tablets by mouth daily or as directed (Patient taking differently: Take 5 mg by mouth daily. Take 1 &1/2 tablets by mouth daily or as directed)  . [DISCONTINUED] chlorthalidone (HYGROTON) 25 MG tablet Take 1 tablet by mouth daily.     Allergies  Allergen Reactions  . Sulfa Antibiotics     Social History   Socioeconomic History  . Marital status: Married    Spouse name: Not on file  . Number of children: Not on file  . Years of education: Not on file  . Highest education level: Not on file  Occupational History  . Not on file  Social Needs  . Financial resource strain: Not on file  . Food insecurity:    Worry: Not on file    Inability: Not on file  . Transportation needs:    Medical: Not on file    Non-medical: Not on file  Tobacco Use  . Smoking status: Former Research scientist (life sciences)  . Smokeless tobacco: Never Used  Substance and Sexual Activity  . Alcohol use: No  . Drug use: No  . Sexual activity: Not on file  Lifestyle  . Physical activity:    Days per week: Not  on file    Minutes per session: Not on file  . Stress: Not on file  Relationships  . Social connections:    Talks on phone: Not on file    Gets together: Not on file    Attends religious service: Not on file    Active member of club or organization: Not on file    Attends meetings of clubs or organizations: Not on file    Relationship status: Not on file  . Intimate partner violence:    Fear of current or ex partner: Not on file    Emotionally abused: Not on file    Physically abused: Not on file    Forced sexual activity: Not on file  Other Topics Concern  . Not on file  Social History Narrative  . Not on file     Review of Systems: General: negative for chills, fever, night sweats or weight changes.  Cardiovascular: negative for chest pain,  dyspnea on exertion, edema, orthopnea, palpitations, paroxysmal nocturnal dyspnea or shortness of breath Dermatological: negative for rash Respiratory: negative for cough or wheezing Urologic: negative for hematuria Abdominal: negative for nausea, vomiting, diarrhea, bright red blood per rectum, melena, or hematemesis Neurologic: negative for visual changes, syncope, or dizziness All other systems reviewed and are otherwise negative except as noted above.    Blood pressure 118/60, pulse (!) 58, height 6' (1.829 m), weight 232 lb (105.2 kg).  General appearance: alert and no distress Neck: no adenopathy, no JVD, supple, symmetrical, trachea midline, thyroid not enlarged, symmetric, no tenderness/mass/nodules and Right carotid bruit Lungs: clear to auscultation bilaterally Heart: 2/6 outflow tract murmur consistent with aortic sclerosis and/or stenosis. Extremities: Venous stasis changes with 1+ pitting edema Pulses: 2+ and symmetric Skin: Dry appearing skin with venous stasis changes Neurologic: Alert and oriented X 3, normal strength and tone. Normal symmetric reflexes. Normal coordination and gait  EKG sinus bradycardia at 58 with right bundle branch block.  I personally reviewed this EKG.  ASSESSMENT AND PLAN:   Acute pulmonary embolism ([place]) History of remote pulmonary embolism on Coumadin anticoagulation with INRs followed in our office on a monthly basis.  His last INR performed on 10/15/2018 was 2.4.  Essential hypertension History of essential hypertension with blood pressure measured today at 118/60.  He is on amlodipine and benazepril.  Continue current meds.  Hyperlipidemia History of hyperlipidemia not on statin therapy with lipid profile performed 10/22/2018 revealing total cholesterol 148, LDL of 83 and HDL 43.  Chronic renal insufficiency, stage 4 (severe) (HCC) Mr. Okane has chronic renal insufficiency with a serum creatinine of 4.3.  He does not wish to pursue  hemodialysis after discussion with his PCP and nephrologist.  His wife Silva Bandy is on hemodialysis.  Bilateral lower extremity edema Mr. Osmundson has had bilateral lower extremity edema.  He was on chlorthalidone however his PCP, Dr. Mayra Neer, changed him to furosemide 40 mg p.o. twice daily which has resulted in considerable weight loss of 13 pounds over the last 2 weeks and almost complete resolution of his peripheral edema.  Cardiac murmur Mr. Shives has a 2/6 outflow tract murmur.  I suspect this is aortic sclerosis and not stenosis although we will check a 2D echo to further evaluate.      Lorretta Harp MD FACP,FACC,FAHA, [facility] 11/12/2018 11:07 AM

## 2018-11-12 NOTE — Assessment & Plan Note (Signed)
Chase [name] has had bilateral lower extremity edema.  He was on chlorthalidone however his PCP, Dr. Mayra Neer, changed him to furosemide 40 mg p.o. twice daily which has resulted in considerable weight loss of 13 pounds over the last 2 weeks and almost complete resolution of his peripheral edema.

## 2018-11-12 NOTE — Assessment & Plan Note (Signed)
Mr. Chase [name] has chronic renal insufficiency with a serum creatinine of 4.3.  He does not wish to pursue hemodialysis after discussion with his PCP and nephrologist.  His wife Silva Bandy is on hemodialysis.

## 2018-11-12 NOTE — Assessment & Plan Note (Signed)
Chase [name] has a 2/6 outflow tract murmur.  I suspect this is aortic sclerosis and not stenosis although we will check a 2D echo to further evaluate.

## 2018-11-12 NOTE — Assessment & Plan Note (Signed)
History of remote pulmonary embolism on Coumadin anticoagulation with INRs followed in our office on a monthly basis.  His last INR performed on 10/15/2018 was 2.4.

## 2018-11-12 NOTE — Patient Instructions (Signed)
Medication Instructions:  Your physician recommends that you continue on your current medications as directed. Please refer to the Current Medication list given to you today.  If you need a refill on your cardiac medications before your next appointment, please call your pharmacy.   Lab work: NONE If you have labs (blood work) drawn today and your tests are completely normal, you will receive your results only by: Marland Kitchen MyChart Message (if you have MyChart) OR . A paper copy in the mail If you have any lab test that is abnormal or we need to change your treatment, we will call you to review the results.  Testing/Procedures: Your physician has requested that you have an echocardiogram. Echocardiography is a painless test that uses sound waves to create images of your heart. It provides your doctor with information about the size and shape of your heart and how well your heart's chambers and valves are working. This procedure takes approximately one hour. There are no restrictions for this procedure.   Follow-Up: At [facility], you and your health needs are our priority.  As part of our continuing mission to provide you with exceptional heart care, we have created designated Provider Care Teams.  These Care Teams include your primary Cardiologist (physician) and Advanced Practice Providers (APPs -  Physician Assistants and Nurse Practitioners) who all work together to provide you with the care you need, when you need it. . You will need a follow up appointment in 6 months with an APP and in 12 months with Dr. Gwenlyn Found.  Please call our office 2 months in advance to schedule [facility] appointment.  You may see one of the following Advanced Practice Providers on your designated Care Team:   . Kerin Ransom, [place] . Almyra Deforest, PA-C . Fabian Sharp, PA-C . Jory Sims, DNP . Rosaria Ferries, PA-C . Roby Lofts, PA-C . Sande Rives, PA-C

## 2018-11-12 NOTE — Assessment & Plan Note (Signed)
History of essential hypertension with blood pressure measured today at 118/60.  He is on amlodipine and benazepril.  Continue current meds.

## 2018-12-02 ENCOUNTER — Telehealth: Payer: Self-pay

## 2018-12-02 NOTE — Telephone Encounter (Signed)

## 2018-12-03 ENCOUNTER — Other Ambulatory Visit: Payer: Self-pay

## 2018-12-03 ENCOUNTER — Ambulatory Visit (INDEPENDENT_AMBULATORY_CARE_PROVIDER_SITE_OTHER): Payer: Medicare Other | Admitting: *Deleted

## 2018-12-03 DIAGNOSIS — I2699 Other pulmonary embolism without acute cor pulmonale: Secondary | ICD-10-CM | POA: Diagnosis not present

## 2018-12-03 DIAGNOSIS — Z7901 Long term (current) use of anticoagulants: Secondary | ICD-10-CM | POA: Diagnosis not present

## 2018-12-03 LAB — POCT INR: INR: 1.8 — AB (ref 2.0–3.0)

## 2018-12-24 ENCOUNTER — Ambulatory Visit (HOSPITAL_COMMUNITY): Payer: Medicare Other | Attending: Cardiology

## 2018-12-24 ENCOUNTER — Other Ambulatory Visit (HOSPITAL_COMMUNITY): Payer: Self-pay | Admitting: Cardiovascular Disease

## 2018-12-24 ENCOUNTER — Other Ambulatory Visit: Payer: Self-pay

## 2018-12-24 DIAGNOSIS — R011 Cardiac murmur, unspecified: Secondary | ICD-10-CM

## 2019-01-01 ENCOUNTER — Other Ambulatory Visit: Payer: Self-pay

## 2019-01-01 DIAGNOSIS — I350 Nonrheumatic aortic (valve) stenosis: Secondary | ICD-10-CM

## 2019-01-10 ENCOUNTER — Telehealth: Payer: Self-pay

## 2019-01-10 NOTE — Telephone Encounter (Signed)

## 2019-01-14 ENCOUNTER — Other Ambulatory Visit: Payer: Self-pay

## 2019-01-14 ENCOUNTER — Ambulatory Visit (INDEPENDENT_AMBULATORY_CARE_PROVIDER_SITE_OTHER): Payer: Medicare Other | Admitting: Pharmacist

## 2019-01-14 DIAGNOSIS — I2699 Other pulmonary embolism without acute cor pulmonale: Secondary | ICD-10-CM

## 2019-01-14 DIAGNOSIS — Z7901 Long term (current) use of anticoagulants: Secondary | ICD-10-CM | POA: Diagnosis not present

## 2019-01-14 LAB — POCT INR: INR: 2 (ref 2.0–3.0)

## 2019-01-21 DIAGNOSIS — E1122 Type 2 diabetes mellitus with diabetic chronic kidney disease: Secondary | ICD-10-CM | POA: Diagnosis not present

## 2019-01-21 DIAGNOSIS — Z794 Long term (current) use of insulin: Secondary | ICD-10-CM | POA: Diagnosis not present

## 2019-01-21 DIAGNOSIS — N184 Chronic kidney disease, stage 4 (severe): Secondary | ICD-10-CM | POA: Diagnosis not present

## 2019-01-21 DIAGNOSIS — M109 Gout, unspecified: Secondary | ICD-10-CM | POA: Diagnosis not present

## 2019-01-21 DIAGNOSIS — E782 Mixed hyperlipidemia: Secondary | ICD-10-CM | POA: Diagnosis not present

## 2019-01-21 DIAGNOSIS — I509 Heart failure, unspecified: Secondary | ICD-10-CM | POA: Diagnosis not present

## 2019-01-23 DIAGNOSIS — Z794 Long term (current) use of insulin: Secondary | ICD-10-CM | POA: Diagnosis not present

## 2019-01-23 DIAGNOSIS — I2782 Chronic pulmonary embolism: Secondary | ICD-10-CM | POA: Diagnosis not present

## 2019-01-23 DIAGNOSIS — I509 Heart failure, unspecified: Secondary | ICD-10-CM | POA: Diagnosis not present

## 2019-01-23 DIAGNOSIS — I10 Essential (primary) hypertension: Secondary | ICD-10-CM | POA: Diagnosis not present

## 2019-01-23 DIAGNOSIS — N185 Chronic kidney disease, stage 5: Secondary | ICD-10-CM | POA: Diagnosis not present

## 2019-01-23 DIAGNOSIS — M109 Gout, unspecified: Secondary | ICD-10-CM | POA: Diagnosis not present

## 2019-01-23 DIAGNOSIS — E1122 Type 2 diabetes mellitus with diabetic chronic kidney disease: Secondary | ICD-10-CM | POA: Diagnosis not present

## 2019-01-23 DIAGNOSIS — M25511 Pain in right shoulder: Secondary | ICD-10-CM | POA: Diagnosis not present

## 2019-01-23 DIAGNOSIS — E782 Mixed hyperlipidemia: Secondary | ICD-10-CM | POA: Diagnosis not present

## 2019-01-30 DIAGNOSIS — M25611 Stiffness of right shoulder, not elsewhere classified: Secondary | ICD-10-CM | POA: Diagnosis not present

## 2019-01-30 DIAGNOSIS — M25511 Pain in right shoulder: Secondary | ICD-10-CM | POA: Diagnosis not present

## 2019-02-11 DIAGNOSIS — I120 Hypertensive chronic kidney disease with stage 5 chronic kidney disease or end stage renal disease: Secondary | ICD-10-CM | POA: Diagnosis not present

## 2019-02-11 DIAGNOSIS — N2581 Secondary hyperparathyroidism of renal origin: Secondary | ICD-10-CM | POA: Diagnosis not present

## 2019-02-11 DIAGNOSIS — N184 Chronic kidney disease, stage 4 (severe): Secondary | ICD-10-CM | POA: Diagnosis not present

## 2019-02-11 DIAGNOSIS — N185 Chronic kidney disease, stage 5: Secondary | ICD-10-CM | POA: Diagnosis not present

## 2019-02-11 DIAGNOSIS — M109 Gout, unspecified: Secondary | ICD-10-CM | POA: Diagnosis not present

## 2019-02-20 DIAGNOSIS — M7122 Synovial cyst of popliteal space [Baker], left knee: Secondary | ICD-10-CM | POA: Diagnosis not present

## 2019-02-20 DIAGNOSIS — M25511 Pain in right shoulder: Secondary | ICD-10-CM | POA: Diagnosis not present

## 2019-02-20 DIAGNOSIS — M25531 Pain in right wrist: Secondary | ICD-10-CM | POA: Diagnosis not present

## 2019-03-03 ENCOUNTER — Telehealth: Payer: Self-pay

## 2019-03-03 NOTE — Telephone Encounter (Signed)

## 2019-03-04 ENCOUNTER — Ambulatory Visit (INDEPENDENT_AMBULATORY_CARE_PROVIDER_SITE_OTHER): Payer: Medicare Other | Admitting: Pharmacist Clinician (PhC)/ Clinical Pharmacy Specialist

## 2019-03-04 ENCOUNTER — Other Ambulatory Visit: Payer: Self-pay

## 2019-03-04 DIAGNOSIS — I2699 Other pulmonary embolism without acute cor pulmonale: Secondary | ICD-10-CM | POA: Diagnosis not present

## 2019-03-04 DIAGNOSIS — Z7901 Long term (current) use of anticoagulants: Secondary | ICD-10-CM | POA: Diagnosis not present

## 2019-03-04 LAB — POCT INR: INR: 2.4 (ref 2.0–3.0)

## 2019-03-24 NOTE — Telephone Encounter (Signed)
This encounter was created in error - please disregard.

## 2019-04-01 ENCOUNTER — Other Ambulatory Visit: Payer: Self-pay | Admitting: Cardiovascular Disease

## 2019-04-02 NOTE — Telephone Encounter (Signed)
Please review for refill. Thank you! 

## 2019-04-23 ENCOUNTER — Ambulatory Visit (INDEPENDENT_AMBULATORY_CARE_PROVIDER_SITE_OTHER): Payer: Medicare Other | Admitting: Pharmacist

## 2019-04-23 ENCOUNTER — Other Ambulatory Visit: Payer: Self-pay

## 2019-04-23 DIAGNOSIS — I2699 Other pulmonary embolism without acute cor pulmonale: Secondary | ICD-10-CM | POA: Diagnosis not present

## 2019-04-23 DIAGNOSIS — Z7901 Long term (current) use of anticoagulants: Secondary | ICD-10-CM | POA: Diagnosis not present

## 2019-04-23 LAB — POCT INR: INR: 2.3 (ref 2.0–3.0)

## 2019-05-08 DIAGNOSIS — R809 Proteinuria, unspecified: Secondary | ICD-10-CM | POA: Diagnosis not present

## 2019-05-08 DIAGNOSIS — N185 Chronic kidney disease, stage 5: Secondary | ICD-10-CM | POA: Diagnosis not present

## 2019-05-08 DIAGNOSIS — D631 Anemia in chronic kidney disease: Secondary | ICD-10-CM | POA: Diagnosis not present

## 2019-05-08 DIAGNOSIS — N189 Chronic kidney disease, unspecified: Secondary | ICD-10-CM | POA: Diagnosis not present

## 2019-05-08 DIAGNOSIS — M109 Gout, unspecified: Secondary | ICD-10-CM | POA: Diagnosis not present

## 2019-05-08 DIAGNOSIS — Z23 Encounter for immunization: Secondary | ICD-10-CM | POA: Diagnosis not present

## 2019-05-08 DIAGNOSIS — N2581 Secondary hyperparathyroidism of renal origin: Secondary | ICD-10-CM | POA: Diagnosis not present

## 2019-05-08 DIAGNOSIS — I120 Hypertensive chronic kidney disease with stage 5 chronic kidney disease or end stage renal disease: Secondary | ICD-10-CM | POA: Diagnosis not present

## 2019-05-15 ENCOUNTER — Telehealth: Payer: Self-pay | Admitting: Cardiology

## 2019-05-15 NOTE — Telephone Encounter (Signed)
LM for patient to call and schedule 6 month followup with Kerin Ransom.

## 2019-05-28 DIAGNOSIS — M109 Gout, unspecified: Secondary | ICD-10-CM | POA: Diagnosis not present

## 2019-06-09 ENCOUNTER — Telehealth: Payer: Self-pay

## 2019-06-09 ENCOUNTER — Encounter: Payer: Self-pay | Admitting: Cardiology

## 2019-06-09 ENCOUNTER — Telehealth (INDEPENDENT_AMBULATORY_CARE_PROVIDER_SITE_OTHER): Payer: Medicare Other | Admitting: Cardiology

## 2019-06-09 VITALS — BP 146/64 | HR 55 | Ht 71.0 in | Wt 222.0 lb

## 2019-06-09 DIAGNOSIS — I10 Essential (primary) hypertension: Secondary | ICD-10-CM

## 2019-06-09 DIAGNOSIS — Z86711 Personal history of pulmonary embolism: Secondary | ICD-10-CM

## 2019-06-09 DIAGNOSIS — R600 Localized edema: Secondary | ICD-10-CM | POA: Diagnosis not present

## 2019-06-09 DIAGNOSIS — I350 Nonrheumatic aortic (valve) stenosis: Secondary | ICD-10-CM

## 2019-06-09 DIAGNOSIS — E782 Mixed hyperlipidemia: Secondary | ICD-10-CM

## 2019-06-09 DIAGNOSIS — N184 Chronic kidney disease, stage 4 (severe): Secondary | ICD-10-CM

## 2019-06-09 DIAGNOSIS — Z7901 Long term (current) use of anticoagulants: Secondary | ICD-10-CM

## 2019-06-09 NOTE — Progress Notes (Signed)
Virtual Visit via Telephone Note   This visit type was conducted due to national recommendations for restrictions regarding the COVID-19 Pandemic (e.g. social distancing) in an effort to limit this patient's exposure and mitigate transmission in our community.  Due to his co-morbid illnesses, this patient is at least at moderate risk for complications without adequate follow up.  This format is felt to be most appropriate for this patient at this time.  The patient did not have access to video technology/had technical difficulties with video requiring transitioning to audio format only (telephone).  All issues noted in this document were discussed and addressed.  No physical exam could be performed with this format.  Please refer to the patient's chart for his  consent to telehealth for [facility].   Date:  06/09/2019   ID:  Chase [name], DOB November 16, 1932, MRN [id number]  Patient Location: Home Provider Location: Home  PCP:  Mayra Neer, MD  Cardiologist:  Dr Gwenlyn Found  Electrophysiologist:  None   Evaluation Performed:  Follow-Up Visit  Chief Complaint:  None  History of Present Illness:    Chase [name] is a 85 y.o. male with a history of remote pulmonary embolism on chronic Coumadin, chronic renal insufficiency stage III-IV followed by Dr. Hollie Salk, past normal coronaries with a low risk Myoview in 2013, mild to moderate aortic stenosis by echocardiogram May 2020, essential hypertension, insulin-dependent diabetes, dyslipidemia, and gout.  He was contacted today for 31-month follow-up.  He is done well since Dr. Gwenlyn Found saw him last.  He had some lower extremity edema at that visit.  His diuretic was changed to furosemide 40 mg twice daily with good results and has had no further issues.  He denies any chest pain or unusual dyspnea or syncope.  He has discussed dialysis with his nephrologist and at this time he does not plan to pursue that.  He has had some issue with gout, allopurinol  was added to his medications.  The patient does not have symptoms concerning for COVID-19 infection (fever, chills, cough, or new shortness of breath).    Past Medical History:  Diagnosis Date  . Arthritis   . Chronic kidney disease    DECREASED KIDNEY FUNCT, DR [place]  . Complication of anesthesia    BOWELS "QUIT" DECREASED KIDNEY FXN S\P HIP REPLACEMENT AT [facility] 2011   . Diabetes mellitus   . Dizziness - light-headed   . Hypertension   . Neuromuscular disorder (HCC)    NUMB/TINGLING ARMS, LEGS, FEET   . Peripheral vascular disease ([place])   . Pulmonary embolus ([place])    1970S + 1999  . Sleep apnea    USES CPAP  AT NIGHT  . Swelling of both ankles    DISCOLORATION  (PURPLE / RED)     Past Surgical History:  Procedure Laterality Date  . ANTERIOR CERVICAL DECOMP/DISCECTOMY FUSION  05/07/2012   Procedure: ANTERIOR CERVICAL DECOMPRESSION/DISCECTOMY FUSION 3 LEVELS;  Surgeon: Kristeen Miss, MD;  Location: [place] [facility];  Service: Neurosurgery;  Laterality: N/A;  Cervical four - five, five- six, six- seven  Anterior cervical decompression/diskectomy/fusion  . APPENDECTOMY    . CARDIAC CATHETERIZATION     [facility] DR BERRY YRS AGO   . CARPAL TUNNEL RELEASE     RIGHT HAND  2012   . CHOLECYSTECTOMY    . ELBOW SURGERY     LEFT  . HEMORROIDECTOMY    . JOINT REPLACEMENT     2011 RT HIP REPLACEMENT  . KNEE ARTHROSCOPY  BILAT     Current Meds  Medication Sig  . allopurinol (ZYLOPRIM) 100 MG tablet Take 100 mg by mouth daily.  Marland Kitchen amLODipine (NORVASC) 10 MG tablet Take 10 mg by mouth daily.  . finasteride (PROSCAR) 5 MG tablet Take 5 mg by mouth daily.  Marland Kitchen HYDROcodone-acetaminophen (NORCO) 7.5-325 MG tablet Take 1 tablet by mouth daily.  . insulin glargine (LANTUS) 100 UNIT/ML injection Inject 35-40 Units into the skin daily. 35-40 units each morning  . metoprolol (LOPRESSOR) 50 MG tablet Take 100 mg by mouth 2 (two) times daily.   . Tamsulosin HCl (FLOMAX) 0.4 MG CAPS Take 0.4 mg by  mouth daily after supper.  . warfarin (COUMADIN) 5 MG tablet Take 5 mg by mouth daily.     Allergies:   Sulfa antibiotics   Social History   Tobacco Use  . Smoking status: Former Research scientist (life sciences)  . Smokeless tobacco: Never Used  Substance Use Topics  . Alcohol use: No  . Drug use: No     Family Hx: The patient's family history includes Colon cancer in his father; Heart attack in his father and mother.  ROS:   Please see the history of present illness.    All other systems reviewed and are negative.   Prior CV studies:   The following studies were reviewed today: Echo May 2020 Myoview 2013  Labs/Other Tests and Data Reviewed:    EKG:  No ECG reviewed.  Recent Labs: No results found for requested labs within last 8760 hours.   Recent Lipid Panel No results found for: CHOL, TRIG, HDL, CHOLHDL, LDLCALC, LDLDIRECT  Wt Readings from Last 3 Encounters:  06/09/19 222 lb (100.7 kg)  11/12/18 232 lb (105.2 kg)  06/08/17 251 lb (113.9 kg)     Objective:    Vital Signs:  Ht 5\' 11"  (1.803 m)   Wt 222 lb (100.7 kg)   BMI 30.96 kg/m    VITAL SIGNS:  reviewed  ASSESSMENT & PLAN:    Mild to moderate AS Normal LVF- asymptomatic  H/O PE On Coumadin, we follow this  CRI-4 Followed by Dr Hollie Salk, the patient does not think he will pursue dialysis   IDDM Per PCP   COVID-19 Education: The signs and symptoms of COVID-19 were discussed with the patient and how to seek care for testing (follow up with PCP or arrange E-visit).  The importance of social distancing was discussed today.  Time:   Today, I have spent 15 minutes with the patient with telehealth technology discussing the above problems.     Medication Adjustments/Labs and Tests Ordered: Current medicines are reviewed at length with the patient today.  Concerns regarding medicines are outlined above.   Tests Ordered: No orders of the defined types were placed in this encounter.   Medication Changes: No orders of  the defined types were placed in this encounter.   Follow Up:  In Person Dr Gwenlyn Found 6 months- he is due for an echo May 2021  Signed, Kerin Ransom, [place]  06/09/2019 2:26 PM    [facility]

## 2019-06-09 NOTE — Patient Instructions (Signed)
Medication Instructions:  Your physician recommends that you continue on your current medications as directed. Please refer to the Current Medication list given to you today. *If you need a refill on your cardiac medications before your next appointment, please call your pharmacy*  Lab Work: Your physician recommends that you continue on your current medications as directed. Please refer to the Current Medication list given to you today. If you have labs (blood work) drawn today and your tests are completely normal, you will receive your results only by: Marland Kitchen MyChart Message (if you have MyChart) OR . A paper copy in the mail If you have any lab test that is abnormal or we need to change your treatment, we will call you to review the results.  Testing/Procedures: Your physician has requested that you have an echocardiogram. Echocardiography is a painless test that uses sound waves to create images of your heart. It provides your doctor with information about the size and shape of your heart and how well your heart's chambers and valves are working. This procedure takes approximately one hour. There are no restrictions for this procedure. SCHEDULE TEST FOR MAY 2021   Follow-Up: At [facility], you and your health needs are our priority.  As part of our continuing mission to provide you with exceptional heart care, we have created designated Provider Care Teams.  These Care Teams include your primary Cardiologist (physician) and Advanced Practice Providers (APPs -  Physician Assistants and Nurse Practitioners) who all work together to provide you with the care you need, when you need it.  Your next appointment:   6 months  The format for your next appointment:   In Person  Provider:   You may see Quay Burow, MD or one of the following Advanced Practice Providers on your designated Care Team:    Kerin Ransom, PA-CCallie Callao, [place]  Coletta Memos, FNP   Other Instructions

## 2019-06-09 NOTE — Telephone Encounter (Signed)

## 2019-06-09 NOTE — Telephone Encounter (Signed)
Contacted patient to discuss AVS Instructions. Gave patient Luke's recommendations from today's virtual office visit. Informed patient that someone from the scheduling dept will be in contact with them to schedule their follow up appt and echo. Patient voiced understanding and AVS mailed.

## 2019-07-01 ENCOUNTER — Other Ambulatory Visit: Payer: Self-pay | Admitting: Cardiovascular Disease

## 2019-07-17 ENCOUNTER — Other Ambulatory Visit: Payer: Self-pay

## 2019-07-17 ENCOUNTER — Ambulatory Visit (INDEPENDENT_AMBULATORY_CARE_PROVIDER_SITE_OTHER): Payer: Medicare Other | Admitting: Pharmacist

## 2019-07-17 DIAGNOSIS — Z86711 Personal history of pulmonary embolism: Secondary | ICD-10-CM

## 2019-07-17 DIAGNOSIS — Z7901 Long term (current) use of anticoagulants: Secondary | ICD-10-CM

## 2019-07-17 LAB — POCT INR: INR: 2.5 (ref 2.0–3.0)

## 2019-08-21 DIAGNOSIS — R410 Disorientation, unspecified: Secondary | ICD-10-CM | POA: Diagnosis not present

## 2019-09-02 ENCOUNTER — Encounter (HOSPITAL_COMMUNITY): Payer: Self-pay

## 2019-09-02 ENCOUNTER — Inpatient Hospital Stay (HOSPITAL_COMMUNITY): Payer: Medicare Other

## 2019-09-02 ENCOUNTER — Other Ambulatory Visit: Payer: Self-pay

## 2019-09-02 ENCOUNTER — Inpatient Hospital Stay (HOSPITAL_COMMUNITY)
Admission: EM | Admit: 2019-09-02 | Discharge: 2019-09-08 | DRG: 291 | Disposition: A | Discharge status: Hospice | Payer: Medicare Other | Attending: Internal Medicine | Admitting: Internal Medicine

## 2019-09-02 ENCOUNTER — Emergency Department (HOSPITAL_COMMUNITY): Payer: Medicare Other

## 2019-09-02 DIAGNOSIS — I350 Nonrheumatic aortic (valve) stenosis: Secondary | ICD-10-CM | POA: Diagnosis present

## 2019-09-02 DIAGNOSIS — Z87891 Personal history of nicotine dependence: Secondary | ICD-10-CM

## 2019-09-02 DIAGNOSIS — I10 Essential (primary) hypertension: Secondary | ICD-10-CM | POA: Diagnosis present

## 2019-09-02 DIAGNOSIS — Z86711 Personal history of pulmonary embolism: Secondary | ICD-10-CM | POA: Diagnosis present

## 2019-09-02 DIAGNOSIS — R0602 Shortness of breath: Secondary | ICD-10-CM | POA: Diagnosis not present

## 2019-09-02 DIAGNOSIS — R609 Edema, unspecified: Secondary | ICD-10-CM | POA: Diagnosis not present

## 2019-09-02 DIAGNOSIS — I5032 Chronic diastolic (congestive) heart failure: Secondary | ICD-10-CM | POA: Diagnosis not present

## 2019-09-02 DIAGNOSIS — M6281 Muscle weakness (generalized): Secondary | ICD-10-CM

## 2019-09-02 DIAGNOSIS — M7989 Other specified soft tissue disorders: Secondary | ICD-10-CM | POA: Diagnosis not present

## 2019-09-02 DIAGNOSIS — T502X5A Adverse effect of carbonic-anhydrase inhibitors, benzothiadiazides and other diuretics, initial encounter: Secondary | ICD-10-CM | POA: Diagnosis present

## 2019-09-02 DIAGNOSIS — D61818 Other pancytopenia: Secondary | ICD-10-CM | POA: Diagnosis present

## 2019-09-02 DIAGNOSIS — E1151 Type 2 diabetes mellitus with diabetic peripheral angiopathy without gangrene: Secondary | ICD-10-CM | POA: Diagnosis present

## 2019-09-02 DIAGNOSIS — E872 Acidosis, unspecified: Secondary | ICD-10-CM | POA: Diagnosis present

## 2019-09-02 DIAGNOSIS — Z66 Do not resuscitate: Secondary | ICD-10-CM

## 2019-09-02 DIAGNOSIS — J9621 Acute and chronic respiratory failure with hypoxia: Secondary | ICD-10-CM | POA: Diagnosis present

## 2019-09-02 DIAGNOSIS — Z515 Encounter for palliative care: Secondary | ICD-10-CM

## 2019-09-02 DIAGNOSIS — D509 Iron deficiency anemia, unspecified: Secondary | ICD-10-CM | POA: Diagnosis present

## 2019-09-02 DIAGNOSIS — E871 Hypo-osmolality and hyponatremia: Secondary | ICD-10-CM | POA: Diagnosis not present

## 2019-09-02 DIAGNOSIS — Z8249 Family history of ischemic heart disease and other diseases of the circulatory system: Secondary | ICD-10-CM

## 2019-09-02 DIAGNOSIS — R0600 Dyspnea, unspecified: Secondary | ICD-10-CM

## 2019-09-02 DIAGNOSIS — I132 Hypertensive heart and chronic kidney disease with heart failure and with stage 5 chronic kidney disease, or end stage renal disease: Secondary | ICD-10-CM | POA: Diagnosis present

## 2019-09-02 DIAGNOSIS — Z794 Long term (current) use of insulin: Secondary | ICD-10-CM

## 2019-09-02 DIAGNOSIS — I129 Hypertensive chronic kidney disease with stage 1 through stage 4 chronic kidney disease, or unspecified chronic kidney disease: Secondary | ICD-10-CM | POA: Diagnosis not present

## 2019-09-02 DIAGNOSIS — J189 Pneumonia, unspecified organism: Secondary | ICD-10-CM | POA: Diagnosis present

## 2019-09-02 DIAGNOSIS — N179 Acute kidney failure, unspecified: Secondary | ICD-10-CM

## 2019-09-02 DIAGNOSIS — R319 Hematuria, unspecified: Secondary | ICD-10-CM | POA: Diagnosis present

## 2019-09-02 DIAGNOSIS — E1122 Type 2 diabetes mellitus with diabetic chronic kidney disease: Secondary | ICD-10-CM | POA: Diagnosis present

## 2019-09-02 DIAGNOSIS — D631 Anemia in chronic kidney disease: Secondary | ICD-10-CM | POA: Diagnosis present

## 2019-09-02 DIAGNOSIS — M25552 Pain in left hip: Secondary | ICD-10-CM | POA: Diagnosis not present

## 2019-09-02 DIAGNOSIS — Z20822 Contact with and (suspected) exposure to covid-19: Secondary | ICD-10-CM | POA: Diagnosis present

## 2019-09-02 DIAGNOSIS — I5033 Acute on chronic diastolic (congestive) heart failure: Secondary | ICD-10-CM | POA: Diagnosis present

## 2019-09-02 DIAGNOSIS — Z800 Family history of malignant neoplasm of digestive organs: Secondary | ICD-10-CM

## 2019-09-02 DIAGNOSIS — I248 Other forms of acute ischemic heart disease: Secondary | ICD-10-CM | POA: Diagnosis present

## 2019-09-02 DIAGNOSIS — N19 Unspecified kidney failure: Secondary | ICD-10-CM | POA: Diagnosis not present

## 2019-09-02 DIAGNOSIS — Z7189 Other specified counseling: Secondary | ICD-10-CM

## 2019-09-02 DIAGNOSIS — R791 Abnormal coagulation profile: Secondary | ICD-10-CM | POA: Diagnosis not present

## 2019-09-02 DIAGNOSIS — I361 Nonrheumatic tricuspid (valve) insufficiency: Secondary | ICD-10-CM | POA: Diagnosis not present

## 2019-09-02 DIAGNOSIS — I340 Nonrheumatic mitral (valve) insufficiency: Secondary | ICD-10-CM | POA: Diagnosis not present

## 2019-09-02 DIAGNOSIS — I4891 Unspecified atrial fibrillation: Secondary | ICD-10-CM | POA: Diagnosis present

## 2019-09-02 DIAGNOSIS — E870 Hyperosmolality and hypernatremia: Secondary | ICD-10-CM | POA: Diagnosis present

## 2019-09-02 DIAGNOSIS — E785 Hyperlipidemia, unspecified: Secondary | ICD-10-CM | POA: Diagnosis present

## 2019-09-02 DIAGNOSIS — E8770 Fluid overload, unspecified: Secondary | ICD-10-CM | POA: Diagnosis not present

## 2019-09-02 DIAGNOSIS — N185 Chronic kidney disease, stage 5: Secondary | ICD-10-CM | POA: Diagnosis present

## 2019-09-02 DIAGNOSIS — R778 Other specified abnormalities of plasma proteins: Secondary | ICD-10-CM | POA: Diagnosis present

## 2019-09-02 DIAGNOSIS — R54 Age-related physical debility: Secondary | ICD-10-CM | POA: Diagnosis present

## 2019-09-02 DIAGNOSIS — J90 Pleural effusion, not elsewhere classified: Secondary | ICD-10-CM | POA: Diagnosis not present

## 2019-09-02 DIAGNOSIS — Z6829 Body mass index (BMI) 29.0-29.9, adult: Secondary | ICD-10-CM

## 2019-09-02 DIAGNOSIS — E876 Hypokalemia: Secondary | ICD-10-CM | POA: Diagnosis not present

## 2019-09-02 DIAGNOSIS — J181 Lobar pneumonia, unspecified organism: Secondary | ICD-10-CM | POA: Diagnosis not present

## 2019-09-02 DIAGNOSIS — R600 Localized edema: Secondary | ICD-10-CM | POA: Diagnosis present

## 2019-09-02 DIAGNOSIS — Z7901 Long term (current) use of anticoagulants: Secondary | ICD-10-CM | POA: Diagnosis not present

## 2019-09-02 DIAGNOSIS — Z96641 Presence of right artificial hip joint: Secondary | ICD-10-CM | POA: Diagnosis present

## 2019-09-02 DIAGNOSIS — I700 Atherosclerosis of aorta: Secondary | ICD-10-CM | POA: Diagnosis present

## 2019-09-02 DIAGNOSIS — M25551 Pain in right hip: Secondary | ICD-10-CM | POA: Diagnosis present

## 2019-09-02 DIAGNOSIS — N184 Chronic kidney disease, stage 4 (severe): Secondary | ICD-10-CM | POA: Diagnosis present

## 2019-09-02 DIAGNOSIS — S79912A Unspecified injury of left hip, initial encounter: Secondary | ICD-10-CM | POA: Diagnosis not present

## 2019-09-02 DIAGNOSIS — R0689 Other abnormalities of breathing: Secondary | ICD-10-CM

## 2019-09-02 DIAGNOSIS — S79911A Unspecified injury of right hip, initial encounter: Secondary | ICD-10-CM | POA: Diagnosis not present

## 2019-09-02 DIAGNOSIS — R7989 Other specified abnormal findings of blood chemistry: Secondary | ICD-10-CM | POA: Diagnosis present

## 2019-09-02 DIAGNOSIS — R52 Pain, unspecified: Secondary | ICD-10-CM

## 2019-09-02 DIAGNOSIS — Z471 Aftercare following joint replacement surgery: Secondary | ICD-10-CM | POA: Diagnosis not present

## 2019-09-02 DIAGNOSIS — I959 Hypotension, unspecified: Secondary | ICD-10-CM | POA: Diagnosis not present

## 2019-09-02 DIAGNOSIS — N189 Chronic kidney disease, unspecified: Secondary | ICD-10-CM | POA: Diagnosis not present

## 2019-09-02 LAB — CBC WITH DIFFERENTIAL/PLATELET
Abs Immature Granulocytes: 0.01 10*3/uL (ref 0.00–0.07)
Basophils Absolute: 0 10*3/uL (ref 0.0–0.1)
Basophils Relative: 1 %
Eosinophils Absolute: 0.1 10*3/uL (ref 0.0–0.5)
Eosinophils Relative: 3 %
HCT: 27.7 % — ABNORMAL LOW (ref 39.0–52.0)
Hemoglobin: 8.8 g/dL — ABNORMAL LOW (ref 13.0–17.0)
Immature Granulocytes: 0 %
Lymphocytes Relative: 18 %
Lymphs Abs: 0.7 10*3/uL (ref 0.7–4.0)
MCH: 28.3 pg (ref 26.0–34.0)
MCHC: 31.8 g/dL (ref 30.0–36.0)
MCV: 89.1 fL (ref 80.0–100.0)
Monocytes Absolute: 0.5 10*3/uL (ref 0.1–1.0)
Monocytes Relative: 13 %
Neutro Abs: 2.5 10*3/uL (ref 1.7–7.7)
Neutrophils Relative %: 65 %
Platelets: 128 10*3/uL — ABNORMAL LOW (ref 150–400)
RBC: 3.11 MIL/uL — ABNORMAL LOW (ref 4.22–5.81)
RDW: 17.2 % — ABNORMAL HIGH (ref 11.5–15.5)
WBC: 3.8 10*3/uL — ABNORMAL LOW (ref 4.0–10.5)
nRBC: 0 % (ref 0.0–0.2)

## 2019-09-02 LAB — PROTIME-INR
INR: 6.3 (ref 0.8–1.2)
Prothrombin Time: 55.7 seconds — ABNORMAL HIGH (ref 11.4–15.2)

## 2019-09-02 LAB — COMPREHENSIVE METABOLIC PANEL
ALT: 11 U/L (ref 0–44)
AST: 7 U/L — ABNORMAL LOW (ref 15–41)
Albumin: 3.3 g/dL — ABNORMAL LOW (ref 3.5–5.0)
Alkaline Phosphatase: 65 U/L (ref 38–126)
Anion gap: 15 (ref 5–15)
BUN: 115 mg/dL — ABNORMAL HIGH (ref 8–23)
CO2: 13 mmol/L — ABNORMAL LOW (ref 22–32)
Calcium: 7.7 mg/dL — ABNORMAL LOW (ref 8.9–10.3)
Chloride: 114 mmol/L — ABNORMAL HIGH (ref 98–111)
Creatinine, Ser: 6.24 mg/dL — ABNORMAL HIGH (ref 0.61–1.24)
GFR calc Af Amer: 9 mL/min — ABNORMAL LOW (ref >60)
GFR calc non Af Amer: 8 mL/min — ABNORMAL LOW (ref >60)
Glucose, Bld: 204 mg/dL — ABNORMAL HIGH (ref 70–99)
Potassium: 4.3 mmol/L (ref 3.5–5.1)
Sodium: 142 mmol/L (ref 135–145)
Total Bilirubin: 0.7 mg/dL (ref 0.3–1.2)
Total Protein: 6.4 g/dL — ABNORMAL LOW (ref 6.5–8.1)

## 2019-09-02 LAB — URINALYSIS, ROUTINE W REFLEX MICROSCOPIC
Bacteria, UA: NONE SEEN
Bilirubin Urine: NEGATIVE
Glucose, UA: NEGATIVE mg/dL
Ketones, ur: NEGATIVE mg/dL
Leukocytes,Ua: NEGATIVE
Nitrite: NEGATIVE
Protein, ur: 30 mg/dL — AB
RBC / HPF: >50 RBC/hpf — ABNORMAL HIGH (ref 0–5)
Specific Gravity, Urine: 1.008 (ref 1.005–1.030)
pH: 5 (ref 5.0–8.0)

## 2019-09-02 LAB — TROPONIN I (HIGH SENSITIVITY)
Troponin I (High Sensitivity): 94 ng/L — ABNORMAL HIGH (ref <18)
Troponin I (High Sensitivity): 97 ng/L — ABNORMAL HIGH (ref <18)

## 2019-09-02 LAB — BRAIN NATRIURETIC PEPTIDE: B Natriuretic Peptide: 441.7 pg/mL — ABNORMAL HIGH (ref 0.0–100.0)

## 2019-09-02 LAB — GLUCOSE, CAPILLARY: Glucose-Capillary: 144 mg/dL — ABNORMAL HIGH (ref 70–99)

## 2019-09-02 LAB — POC SARS CORONAVIRUS 2 AG -  ED: SARS Coronavirus 2 Ag: NEGATIVE

## 2019-09-02 LAB — SARS CORONAVIRUS 2 (TAT 6-24 HRS): SARS Coronavirus 2: NEGATIVE

## 2019-09-02 MED ORDER — TAMSULOSIN HCL 0.4 MG PO CAPS
0.4000 mg | ORAL_CAPSULE | Freq: Every day | ORAL | Status: DC
  Administered 2019-09-03 – 2019-09-07 (×5): 0.4 mg via ORAL
  Filled 2019-09-02 (×5): qty 1

## 2019-09-02 MED ORDER — INSULIN ASPART 100 UNIT/ML ~~LOC~~ SOLN
0.0000 [IU] | Freq: Three times a day (TID) | SUBCUTANEOUS | Status: DC
  Administered 2019-09-06: 1 [IU] via SUBCUTANEOUS
  Administered 2019-09-06: 2 [IU] via SUBCUTANEOUS
  Administered 2019-09-07 (×2): 1 [IU] via SUBCUTANEOUS
  Administered 2019-09-08: 2 [IU] via SUBCUTANEOUS

## 2019-09-02 MED ORDER — ACETAMINOPHEN 325 MG PO TABS: 650.0000 mg | ORAL_TABLET | Freq: Four times a day (QID) | ORAL | Status: DC | PRN

## 2019-09-02 MED ORDER — METOPROLOL TARTRATE 100 MG PO TABS
100.0000 mg | ORAL_TABLET | Freq: Two times a day (BID) | ORAL | Status: DC
  Administered 2019-09-02: 100 mg via ORAL
  Filled 2019-09-02: qty 1

## 2019-09-02 MED ORDER — ONDANSETRON HCL 4 MG PO TABS: 4.0000 mg | ORAL_TABLET | Freq: Four times a day (QID) | ORAL | Status: DC | PRN

## 2019-09-02 MED ORDER — WARFARIN - PHARMACIST DOSING INPATIENT: Freq: Every day | Status: DC

## 2019-09-02 MED ORDER — INSULIN GLARGINE 100 UNIT/ML ~~LOC~~ SOLN
20.0000 [IU] | Freq: Every day | SUBCUTANEOUS | Status: DC
  Administered 2019-09-03 – 2019-09-05 (×3): 20 [IU] via SUBCUTANEOUS
  Filled 2019-09-02 (×3): qty 0.2

## 2019-09-02 MED ORDER — FUROSEMIDE 10 MG/ML IJ SOLN
40.0000 mg | Freq: Once | INTRAMUSCULAR | Status: AC
  Administered 2019-09-02: 40 mg via INTRAVENOUS
  Filled 2019-09-02: qty 4

## 2019-09-02 MED ORDER — HYDROCODONE-ACETAMINOPHEN 7.5-325 MG PO TABS
1.0000 | ORAL_TABLET | Freq: Four times a day (QID) | ORAL | Status: DC | PRN
  Administered 2019-09-02 – 2019-09-05 (×4): 1 via ORAL
  Filled 2019-09-02 (×4): qty 1

## 2019-09-02 MED ORDER — SODIUM BICARBONATE 650 MG PO TABS
1300.0000 mg | ORAL_TABLET | Freq: Three times a day (TID) | ORAL | Status: DC
  Administered 2019-09-02 – 2019-09-06 (×10): 1300 mg via ORAL
  Filled 2019-09-02 (×11): qty 2

## 2019-09-02 MED ORDER — NITROGLYCERIN 0.4 MG SL SUBL: 0.4000 mg | SUBLINGUAL_TABLET | SUBLINGUAL | Status: DC | PRN

## 2019-09-02 MED ORDER — FUROSEMIDE 10 MG/ML IJ SOLN
60.0000 mg | Freq: Two times a day (BID) | INTRAMUSCULAR | Status: DC
  Administered 2019-09-02: 60 mg via INTRAVENOUS
  Filled 2019-09-02: qty 6

## 2019-09-02 MED ORDER — ACETAMINOPHEN 650 MG RE SUPP: 650.0000 mg | Freq: Four times a day (QID) | RECTAL | Status: DC | PRN

## 2019-09-02 MED ORDER — CALCITRIOL 0.25 MCG PO CAPS
0.2500 ug | ORAL_CAPSULE | ORAL | Status: DC
  Administered 2019-09-02 – 2019-09-08 (×4): 0.25 ug via ORAL
  Filled 2019-09-02 (×5): qty 1

## 2019-09-02 MED ORDER — ONDANSETRON HCL 4 MG/2ML IJ SOLN: 4.0000 mg | Freq: Four times a day (QID) | INTRAMUSCULAR | Status: DC | PRN

## 2019-09-02 NOTE — ED Notes (Signed)
Lattie Haw ( Daughter) [phone]

## 2019-09-02 NOTE — ED Triage Notes (Signed)
Pt from home via ems; c/o sob x 1 month, with increased edema to legs x 1 month, worse in past week; no CHF; hx kidney failure, not on dialysis; chronic generalized pain; exertional sob; lung sounds clear with ems; denies sick contacts  96-98 % RA 134/64 P 64  RR 16 CBG 203 T 98.30f

## 2019-09-02 NOTE — ED Provider Notes (Signed)
[facility] EMERGENCY DEPARTMENT Provider Note   CSN: [id number] Arrival date & time: 09/02/19  1310     History Chief Complaint  Patient presents with  . Shortness of Breath  . Leg Swelling    Chase [name] is a 86 y.o. male.  HPI     Chase [name] is a 86 y.o. male, with a history of CKD, HTN, PE, presenting to the ED with shortness of breath worsening over the last month.  Also notes lower extremity edema bilaterally, but states this is unchanged. He states that his office visits there has been concern for increasing fluid overload.  Beginning today, he took 80 mg Lasix instead of his usual 40 mg.  He also notes his nephrologist has been urging him to consider dialysis due to his increasing renal failure. Denies fever/chills, acute chest pain, abdominal pain, N/V/D, difficulty urinating, acute urinary symptoms, dizziness, diaphoresis, acute cough, or any other complaints.   Past Medical History:  Diagnosis Date  . Arthritis   . Chronic kidney disease    DECREASED KIDNEY FUNCT, DR GOLDSBOROUGH  . Complication of anesthesia    BOWELS "QUIT" DECREASED KIDNEY FXN S\P HIP REPLACEMENT AT [facility] 2011   . Diabetes mellitus   . Dizziness - light-headed   . Hypertension   . Neuromuscular disorder (HCC)    NUMB/TINGLING ARMS, LEGS, FEET   . Peripheral vascular disease ([place])   . Pulmonary embolus ([place])    1970S + 1999  . Sleep apnea    USES CPAP  AT NIGHT  . Swelling of both ankles    DISCOLORATION  (PURPLE / RED)      Patient Active Problem List   Diagnosis Date Noted  . Chronic renal insufficiency, stage 4 (severe) ([place]) 11/12/2018  . Moderate aortic stenosis 11/12/2018  . Bilateral lower extremity edema 11/12/2018  . Essential hypertension 06/08/2017  . Hyperlipidemia 06/08/2017  . Long term (current) use of anticoagulants 10/30/2012  . History of pulmonary embolism 10/30/2012    Past Surgical History:  Procedure Laterality Date  . ANTERIOR  CERVICAL DECOMP/DISCECTOMY FUSION  05/07/2012   Procedure: ANTERIOR CERVICAL DECOMPRESSION/DISCECTOMY FUSION 3 LEVELS;  Surgeon: Kristeen Miss, MD;  Location: [facility];  Service: Neurosurgery;  Laterality: N/A;  Cervical four - five, five- six, six- seven  Anterior cervical decompression/diskectomy/fusion  . APPENDECTOMY    . CARDIAC CATHETERIZATION     [facility] DR [facility]Y YRS AGO   . CARPAL TUNNEL RELEASE     RIGHT HAND  2012   . CHOLECYSTECTOMY    . ELBOW SURGERY     LEFT  . HEMORROIDECTOMY    . JOINT REPLACEMENT     2011 RT HIP REPLACEMENT  . KNEE ARTHROSCOPY     BILAT       Family History  Problem Relation Age of Onset  . Heart attack Mother   . Heart attack Father   . Colon cancer Father     Social History   Tobacco Use  . Smoking status: Former Research scientist (life sciences)  . Smokeless tobacco: Never Used  Substance Use Topics  . Alcohol use: No  . Drug use: No    Home Medications Prior to Admission medications   Medication Sig Start Date End Date Taking? Authorizing Provider  allopurinol (ZYLOPRIM) 100 MG tablet Take 100 mg by mouth daily. 05/14/19   [provider]  amLODipine (NORVASC) 10 MG tablet Take 10 mg by mouth daily.    [provider]  finasteride (PROSCAR) 5  MG tablet Take 5 mg by mouth daily. 12/09/13   [provider]  furosemide (LASIX) 40 MG tablet Take 40 mg by mouth 2 (two) times daily.    [provider]  HYDROcodone-acetaminophen (NORCO) 7.5-325 MG tablet Take 1 tablet by mouth daily.    [provider]  insulin glargine (LANTUS) 100 UNIT/ML injection Inject 35-40 Units into the skin daily. 35-40 units each morning    [provider]  metoprolol (LOPRESSOR) 50 MG tablet Take 100 mg by mouth 2 (two) times daily.     [provider]  Tamsulosin HCl (FLOMAX) 0.4 MG CAPS Take 0.4 mg by mouth daily after supper.    [provider]  warfarin (COUMADIN) 5 MG tablet TAKE 1 & 1/2 (ONE & ONE-HALF) TABLETS BY  MOUTH ONCE DAILY OR  AS  DIRECTED 07/01/19   Lorretta Harp, MD    Allergies    Sulfa antibiotics  Review of Systems   Review of Systems  Constitutional: Negative for chills, diaphoresis and fever.  Respiratory: Positive for shortness of breath. Negative for cough.   Cardiovascular: Positive for leg swelling. Negative for chest pain.  Gastrointestinal: Negative for abdominal pain, blood in stool, diarrhea, nausea and vomiting.  Genitourinary: Negative for difficulty urinating, dysuria, flank pain, frequency and hematuria.  Neurological: Negative for dizziness and syncope.  All other systems reviewed and are negative.   Physical Exam Updated Vital Signs BP 135/62   Pulse (!) 55   Temp (!) 97.5 F (36.4 C) (Oral)   Resp 17   Ht 6' (1.829 m)   Wt 99.8 kg   SpO2 96%   BMI 29.84 kg/m   Physical Exam Vitals and nursing note reviewed.  Constitutional:      General: He is not in acute distress.    Appearance: He is well-developed. He is not diaphoretic.  HENT:     Head: Normocephalic and atraumatic.     Mouth/Throat:     Mouth: Mucous membranes are moist.     Pharynx: Oropharynx is clear.  Eyes:     Conjunctiva/sclera: Conjunctivae normal.  Cardiovascular:     Rate and Rhythm: Normal rate and regular rhythm.     Pulses: Normal pulses.          Radial pulses are 2+ on the right side and 2+ on the left side.       Dorsalis pedis pulses are 2+ on the right side and 2+ on the left side.       Posterior tibial pulses are 2+ on the right side and 2+ on the left side.     Heart sounds: Normal heart sounds.     Comments: Tactile temperature in the extremities appropriate and equal bilaterally. Pulmonary:     Effort: Pulmonary effort is normal. No respiratory distress.     Breath sounds: Normal breath sounds.  Abdominal:     Palpations: Abdomen is soft.     Tenderness: There is no abdominal tenderness. There is no guarding.  Musculoskeletal:     Cervical back: Neck supple.       Right lower leg: Edema present.     Left lower leg: Edema present.  Lymphadenopathy:     Cervical: No cervical adenopathy.  Skin:    General: Skin is warm and dry.  Neurological:     Mental Status: He is alert.     Comments: Sensation light touch grossly intact in the bilateral lower extremities. Strength 5/5 with plantar and dorsiflexion of the  bilateral lower extremities.  Psychiatric:        Mood and Affect: Mood and affect normal.        Speech: Speech normal.        Behavior: Behavior normal.     ED Results / Procedures / Treatments   Labs (all labs ordered are listed, but only abnormal results are displayed) Labs Reviewed - No data to display  EKG EKG Interpretation  Date/Time:  Tuesday September 02 2019 13:53:09 EST Ventricular Rate:  55 PR Interval:    QRS Duration: 152 QT Interval:  486 QTC Calculation: 465 R Axis:   51 Text Interpretation: Sinus rhythm IVCD, consider atypical RBBB No STEMI Confirmed by Octaviano Glow [phone]) on 09/02/2019 2:19:52 PM   Radiology DG Chest Portable 1 View  Result Date: 09/02/2019 CLINICAL DATA:  Shortness of breath EXAM: PORTABLE CHEST 1 VIEW COMPARISON:  October 22, 2018 FINDINGS: There is a pleural effusion on each side, larger on the right than the left. There is consolidation throughout much of the right middle and lower lobes. There is slight atelectasis in the left base. Heart is mildly enlarged with pulmonary vascularity normal. No adenopathy. There is postoperative change in the lower cervical spine. There is a radiopaque appearing structure in the right base region medially of uncertain etiology. IMPRESSION: Consolidation involving much of the right middle and lower lobes. Bilateral pleural effusions, larger on the right than the left. Heart mildly enlarged, stable. No adenopathy. Radiopaque structure in the medial right base of uncertain etiology. This structure measures 4.4 x 2.6 cm. Its etiology is uncertain. Correlation with  lateral chest radiograph potentially could be helpful for further assessment. Electronically Signed   By: Lowella Grip III M.D.   On: 09/02/2019 14:31    Procedures Procedures (including critical care time)  Medications Ordered in ED Medications  furosemide (LASIX) injection 40 mg (40 mg Intravenous Given 09/02/19 1522)    ED Course  I have reviewed the triage vital signs and the nursing notes.  Pertinent labs & imaging results that were available during my care of the patient were reviewed by me and considered in my medical decision making (see chart for details).    MDM Rules/Calculators/A&P                      Patient presents with shortness of breath over the past month. Patient is nontoxic appearing, afebrile, not tachycardic, not tachypneic, not hypotensive, maintains excellent SPO2 on room air, and is in no apparent distress.  He does have peripheral edema and has pleural effusions on chest x-ray.  Findings and plan of care discussed with Myrtie Cruise, MD. Dr. Langston Masker personally evaluated and examined this patient.  End of shift patient care handoff report given to [facility], PA-C. Plan: Lab work pending.  Final Clinical Impression(s) / ED Diagnoses Final diagnoses:  None    Rx / DC Orders ED Discharge Orders    None       Layla Maw 09/02/19 1617    Wyvonnia Dusky, MD 09/02/19 [phone]

## 2019-09-02 NOTE — Progress Notes (Signed)
ANTICOAGULATION CONSULT NOTE - Initial Consult  Pharmacy Consult for warfarin Indication: history of VTE   Patient Measurements: Height: 6' (182.9 cm) Weight: 220 lb (99.8 kg) IBW/kg (Calculated) : 77.6   Vital Signs: Temp: 97.5 F (36.4 C) (01/19 1320) Temp Source: Oral (01/19 1320) BP: 128/78 (01/19 1730) Pulse Rate: 57 (01/19 1730)  Labs: Recent Labs    09/02/19 1351 09/02/19 1412  HGB 8.8*  --   HCT 27.7*  --   PLT 128*  --   LABPROT  --  55.7*  INR  --  6.3*  CREATININE 6.24*  --   TROPONINIHS 94*  --       Medical History: Past Medical History:  Diagnosis Date  . Arthritis   . Chronic kidney disease    DECREASED KIDNEY FUNCT, DR GOLDSBOROUGH  . Complication of anesthesia    BOWELS "QUIT" DECREASED KIDNEY FXN S\P HIP REPLACEMENT AT [facility] 2011   . Diabetes mellitus   . Dizziness - light-headed   . Hypertension   . Neuromuscular disorder (HCC)    NUMB/TINGLING ARMS, LEGS, FEET   . Peripheral vascular disease ([place])   . Pulmonary embolus ([place])    1970S + 1999  . Sleep apnea    USES CPAP  AT NIGHT  . Swelling of both ankles    DISCOLORATION  (PURPLE / RED)       Assessment:  86 yo male admitted with SOB. Pt is on warfarin for hx of VTE. His INR is supratherapeutic at 6.3 today with no associated bleeding. PTA warfarin dose is 5 mg po daily.    Goal of Therapy:  INR 2-3 Monitor platelets by anticoagulation protocol: Yes   Plan:  -Hold warfarin -Resume when INR is appropriate   Harvel Quale 09/02/2019,6:25 PM

## 2019-09-02 NOTE — H&P (Addendum)
History and Physical  Chase [name] LFY:[id number] DOB: 1932-11-27 DOA: 09/02/2019  PCP: Mayra Neer, MD Patient coming from: Home   I have personally briefly reviewed patient's old medical records in [place]   Chief Complaint: SOB  HPI: Chase [name] is a 86 y.o. male past medical history significant for chronic kidney disease stage IV,  prior cr 2.0 2013, diabetes, peripheral vascular disease, history of PE, who presents complaining of worsening shortness of breath, on and off for the last several hour month.  Patient reports shortness of breath even walking from his house to the garage, he gave out his leg he gets worsening shortness of breath and he has to stop.  He also report episode of chest pains on and off last a few minutes sharp.  Last episode couple of weeks ago.  He denies currently chest pain. He denies fever or cough.  He does report some abdominal pain and distention.  Evaluation in the ED: Sodium 142, potassium 4.3, chloride 114, CO2 13, glucose 204, BUN 115, creatinine 6.2, calcium 7.7, albumin 3.3, AST 7 ALT 11, total protein 6.4, BNP 441, troponin 94, hemoglobin 8.8, white blood cell 3.8, platelets 128, INR 6.3, SARS coronavirus 2 -.  Chest x-ray: Consolidation involving much of the right middle and lower lobes.  Bilateral pleural effusions, larger on the right than the left. Heart mildly enlarged, stable. No adenopathy.Radiopaque structure in the medial right base of uncertain etiology. This structure measures 4.4 x 2.6 cm. Its etiology is uncertain. Correlation with lateral chest radiograph potentially could be helpful for further assessment.     Review of Systems: All systems reviewed and apart from history of presenting illness, are negative.  Past Medical History:  Diagnosis Date  . Arthritis   . Chronic kidney disease    DECREASED KIDNEY FUNCT, DR GOLDSBOROUGH  . Complication of anesthesia    BOWELS "QUIT" DECREASED KIDNEY FXN S\P HIP  REPLACEMENT AT [facility] 2011   . Diabetes mellitus   . Dizziness - light-headed   . Hypertension   . Neuromuscular disorder (HCC)    NUMB/TINGLING ARMS, LEGS, FEET   . Peripheral vascular disease ([place])   . Pulmonary embolus ([place])    1970S + 1999  . Sleep apnea    USES CPAP  AT NIGHT  . Swelling of both ankles    DISCOLORATION  (PURPLE / RED)     Past Surgical History:  Procedure Laterality Date  . ANTERIOR CERVICAL DECOMP/DISCECTOMY FUSION  05/07/2012   Procedure: ANTERIOR CERVICAL DECOMPRESSION/DISCECTOMY FUSION 3 LEVELS;  Surgeon: Kristeen Miss, MD;  Location: [facility];  Service: Neurosurgery;  Laterality: N/A;  Cervical four - five, five- six, six- seven  Anterior cervical decompression/diskectomy/fusion  . APPENDECTOMY    . CARDIAC CATHETERIZATION     [facility] DR [facility]Y YRS AGO   . CARPAL TUNNEL RELEASE     RIGHT HAND  2012   . CHOLECYSTECTOMY    . ELBOW SURGERY     LEFT  . HEMORROIDECTOMY    . JOINT REPLACEMENT     2011 RT HIP REPLACEMENT  . KNEE ARTHROSCOPY     BILAT   Social History:  reports that he has quit smoking. He has never used smokeless tobacco. He reports that he does not drink alcohol or use drugs.   Allergies  Allergen Reactions  . Sulfa Antibiotics Other (See Comments)    From childhood- reaction??    Family History  Problem Relation Age of Onset  .  Heart attack Mother   . Heart attack Father   . Colon cancer Father    Prior to Admission medications   Medication Sig Start Date End Date Taking? Authorizing Provider  allopurinol (ZYLOPRIM) 100 MG tablet Take 100 mg by mouth daily. 05/14/19   [provider]  amLODipine (NORVASC) 10 MG tablet Take 10 mg by mouth daily.    [provider]  amoxicillin (AMOXIL) 500 MG capsule Take 2,000 mg by mouth See admin instructions. Take 2,000 mg by mouth one hour prior to dental appointments    [provider]  calcitRIOL (ROCALTROL) 0.25 MCG capsule Take 0.25 mcg by mouth every other day.  07/14/19   [provider]  finasteride (PROSCAR) 5 MG tablet Take 5 mg by mouth daily. 12/09/13   [provider]  furosemide (LASIX) 40 MG tablet Take 40 mg by mouth 2 (two) times daily.    [provider]  HYDROcodone-acetaminophen (NORCO) 7.5-325 MG tablet Take 1 tablet by mouth daily.    [provider]  insulin glargine (LANTUS) 100 UNIT/ML injection Inject 35-40 Units into the skin daily. 35-40 units each morning    [provider]  metoprolol (LOPRESSOR) 50 MG tablet Take 100 mg by mouth 2 (two) times daily.     [provider]  Tamsulosin HCl (FLOMAX) 0.4 MG CAPS Take 0.4 mg by mouth daily after supper.    [provider]  warfarin (COUMADIN) 5 MG tablet TAKE 1 & 1/2 (ONE & ONE-HALF) TABLETS BY MOUTH ONCE DAILY OR  AS  DIRECTED Patient taking differently: Take 5 mg by mouth.  07/01/19   Lorretta Harp, MD   Physical Exam: Vitals:   09/02/19 1515 09/02/19 1545 09/02/19 1630 09/02/19 1730  BP: 132/73 133/69 130/65 128/78  Pulse: (!) 59 (!) 56 (!) 58 (!) 57  Resp: 13 15 18 19   Temp:      TempSrc:      SpO2: 96% 96% 97% 96%  Weight:      Height:         General exam: Moderately built and nourished patient, lying comfortably supine on the gurney in no obvious distress.  Head, eyes and ENT: Nontraumatic and normocephalic. Pupils equally reacting to light and accommodation. Oral mucosa moist.  Neck: Supple. positive JVD, carotid bruit or thyromegaly.  Lymphatics: No lymphadenopathy.  Respiratory system: Clear to auscultation. No increased work of breathing.  Cardiovascular system: S1 and S2 heard, RRR. Plus 2 edema  Gastrointestinal system: Abdomen is distended, soft and nontender. Normal bowel sounds heard. No organomegaly or masses appreciated.  Central nervous system: Alert and oriented. No focal neurological deficits.  Extremities: Symmetric 5 x 5 power. Peripheral pulses symmetrically felt. chronic venous  stasis. Hyperpigmentation.   Skin: No rashes or acute findings.  Musculoskeletal system: Negative exam.  Psychiatry: Pleasant and cooperative.   Labs on Admission:  Basic Metabolic Panel: Recent Labs  Lab 09/02/19 1351  NA 142  K 4.3  CL 114*  CO2 13*  GLUCOSE 204*  BUN 115*  CREATININE 6.24*  CALCIUM 7.7*   Liver Function Tests: Recent Labs  Lab 09/02/19 1351  AST 7*  ALT 11  ALKPHOS 65  BILITOT 0.7  PROT 6.4*  ALBUMIN 3.3*   No results for input(s): LIPASE, AMYLASE in the last 168 hours. No results for input(s): AMMONIA in the last 168 hours. CBC: Recent Labs  Lab 09/02/19 1351  WBC 3.8*  NEUTROABS 2.5  HGB 8.8*  HCT 27.7*  MCV  89.1  PLT 128*   Cardiac Enzymes: No results for input(s): CKTOTAL, CKMB, CKMBINDEX, TROPONINI in the last 168 hours.  BNP (last 3 results) No results for input(s): PROBNP in the last 8760 hours. CBG: No results for input(s): GLUCAP in the last 168 hours.  Radiological Exams on Admission: DG Chest Portable 1 View  Result Date: 09/02/2019 CLINICAL DATA:  Shortness of breath EXAM: PORTABLE CHEST 1 VIEW COMPARISON:  October 22, 2018 FINDINGS: There is a pleural effusion on each side, larger on the right than the left. There is consolidation throughout much of the right middle and lower lobes. There is slight atelectasis in the left base. Heart is mildly enlarged with pulmonary vascularity normal. No adenopathy. There is postoperative change in the lower cervical spine. There is a radiopaque appearing structure in the right base region medially of uncertain etiology. IMPRESSION: Consolidation involving much of the right middle and lower lobes. Bilateral pleural effusions, larger on the right than the left. Heart mildly enlarged, stable. No adenopathy. Radiopaque structure in the medial right base of uncertain etiology. This structure measures 4.4 x 2.6 cm. Its etiology is uncertain. Correlation with lateral chest radiograph potentially  could be helpful for further assessment. Electronically Signed   By: Lowella Grip III M.D.   On: 09/02/2019 14:31    EKG: Independently reviewed. Sinus old RBBB  Assessment/Plan Active Problems:   Long term (current) use of anticoagulants   History of pulmonary embolism   Essential hypertension   Hyperlipidemia   Chronic renal insufficiency, stage 4 (severe) (HCC)   Moderate aortic stenosis   Bilateral lower extremity edema   Renal failure   AKI (acute kidney injury) (HCC)   Metabolic acidosis  1-Acute on Chronic Stage IV Renal Failure: -Per records patient creatinine around 4.  Creatinine today up to 6.  Patient with evidence of volume overload, lower extremity edema abdominal distention, bilateral pleural effusion. -We will continue with IV Lasix 60 mg IV twice daily. -Monitor urine output.,  Daily weight. -Check urinalysis -Nephrology to be consulted by ED team.   2-Acute on chronic diastolic heart failure exacerbation: Patient presented with shortness of breath, worsening lower extremity edema bilateral pleural effusion. IV Lasix 60 mg IV twice daily.  3-Positive troponin, episode of chest pain several weeks ago: Cycle troponin, check echo. Discussed with cardiology, patient needs nephrology consultation, volume management.  As needed nitroglycerin. Continue with metoprolol. Patient with supratherapeutic INR.  Holding anticoagulation.  4-History of PE on chronic Coumadin: Supra- therapeutic INR. No evidence of acute bleeding. Hold Coumadin. Repeat INR in a.m.  5-Abnormal chest x-ray, with consolidation, radiopaque structure in the medial right base of uncertain etiology 4 x 2 cm; -We will proceed with CT chest.  6-Hip pain, right; will check X ray.  7-Anemia; pancytopenia; no prior baseline. Check anemia panel. Monitor hb.  8-DM; resume lantus, lower home dose. SSI.   DVT Prophylaxis: Hold anticoagulation in event of supra-therapeutic INR>  Code Status:  Patient wishes to be full code.  Family Communication: no family at bedside.  Disposition Plan: Admit to the hospital for further evaluation of acute on chronic renal failure, probably diastolic heart failure exacerbation.  Patient will require IV Lasix.   Time spent: 75 minutes. Elmarie Shiley MD [facility]   09/02/2019, 6:13 PM

## 2019-09-02 NOTE — Progress Notes (Addendum)
Pt experienced a 2.3 second pause in rhythm with a HR between 20-30s while sleeping. HR is currently 54, pt asymptomatic. MD notified. Will continue to monitor.  Pt HR continued to fluctuate asymptomatically between 38-60s while sleeping 2am; Pt has remained in 50's since being awake this morning.

## 2019-09-02 NOTE — ED Provider Notes (Signed)
  Physical Exam  BP 128/78   Pulse (!) 57   Temp (!) 97.5 F (36.4 C) (Oral)   Resp 19   Ht 6' (1.829 m)   Wt 99.8 kg   SpO2 96%   BMI 29.84 kg/m   Physical Exam  Gena: appears nontoxic Pulm: speaking in full sentences. No respiratory distress.  CV: bilateral pitting edema and ttp of bilateral legs with chronic skin changes.   ED Course/Procedures     Procedures  MDM   Patient signed out to me by Marval Regal, PA-C.  Please see previous notes for further history.  In brief, patient presenting for evaluation of worsening shortness of breath and lower extremity edema.  History of fluid overload due to kidney dysfunction.  Most recent echo was normal.  He is on Lasix, has been taking this as prescribed.  He has dyspnea with exertion, no shortness of breath at rest.  Exam is reassuring, no crackles or rails.  Labs pending.  Labs show AKI of 6.24.  Per documentation from March 2020, patient's creatinine at that time was 4.  BNP elevated at 441, no recent to compare.  Trope elevated 94, this is likely due to demand.  He is without chest pain, doubt ACS.  INR elevated at 6.3, he is supratherapeutic.  He denies any bleeding.  Will give Lasix.  On reassessment after Lasix, patient reports his breathing is slightly improved.  He remains on 2 L of oxygen via nasal cannula for comfort.  His sats are stable.  Due to his AKI, elevated Trope, and elevated INR, he will need to be admitted.  Discussed with patient, who is agreeable.  Discussed with Dr. Tyrell Antonio from [facility], patient to be admitted.       Franchot Heidelberg, PA-C 09/02/19 1849    Ezequiel Essex, MD 09/02/19 2325

## 2019-09-03 ENCOUNTER — Inpatient Hospital Stay (HOSPITAL_COMMUNITY): Payer: Medicare Other

## 2019-09-03 DIAGNOSIS — I5033 Acute on chronic diastolic (congestive) heart failure: Secondary | ICD-10-CM

## 2019-09-03 DIAGNOSIS — Z86711 Personal history of pulmonary embolism: Secondary | ICD-10-CM

## 2019-09-03 DIAGNOSIS — R778 Other specified abnormalities of plasma proteins: Secondary | ICD-10-CM

## 2019-09-03 DIAGNOSIS — I361 Nonrheumatic tricuspid (valve) insufficiency: Secondary | ICD-10-CM

## 2019-09-03 DIAGNOSIS — E872 Acidosis: Secondary | ICD-10-CM

## 2019-09-03 DIAGNOSIS — N179 Acute kidney failure, unspecified: Secondary | ICD-10-CM

## 2019-09-03 DIAGNOSIS — N184 Chronic kidney disease, stage 4 (severe): Secondary | ICD-10-CM

## 2019-09-03 DIAGNOSIS — R791 Abnormal coagulation profile: Secondary | ICD-10-CM

## 2019-09-03 DIAGNOSIS — I340 Nonrheumatic mitral (valve) insufficiency: Secondary | ICD-10-CM

## 2019-09-03 DIAGNOSIS — Z7901 Long term (current) use of anticoagulants: Secondary | ICD-10-CM

## 2019-09-03 DIAGNOSIS — J9621 Acute and chronic respiratory failure with hypoxia: Secondary | ICD-10-CM

## 2019-09-03 DIAGNOSIS — J189 Pneumonia, unspecified organism: Secondary | ICD-10-CM

## 2019-09-03 LAB — BASIC METABOLIC PANEL
Anion gap: 16 — ABNORMAL HIGH (ref 5–15)
BUN: 116 mg/dL — ABNORMAL HIGH (ref 8–23)
CO2: 15 mmol/L — ABNORMAL LOW (ref 22–32)
Calcium: 7.8 mg/dL — ABNORMAL LOW (ref 8.9–10.3)
Chloride: 113 mmol/L — ABNORMAL HIGH (ref 98–111)
Creatinine, Ser: 6.16 mg/dL — ABNORMAL HIGH (ref 0.61–1.24)
GFR calc Af Amer: 9 mL/min — ABNORMAL LOW (ref >60)
GFR calc non Af Amer: 8 mL/min — ABNORMAL LOW (ref >60)
Glucose, Bld: 112 mg/dL — ABNORMAL HIGH (ref 70–99)
Potassium: 4.4 mmol/L (ref 3.5–5.1)
Sodium: 144 mmol/L (ref 135–145)

## 2019-09-03 LAB — HIV ANTIBODY (ROUTINE TESTING W REFLEX): HIV Screen 4th Generation wRfx: NONREACTIVE

## 2019-09-03 LAB — SODIUM, URINE, RANDOM: Sodium, Ur: 90 mmol/L

## 2019-09-03 LAB — HEMOGLOBIN A1C
Hgb A1c MFr Bld: 7 % — ABNORMAL HIGH (ref 4.8–5.6)
Mean Plasma Glucose: 154.2 mg/dL

## 2019-09-03 LAB — GLUCOSE, CAPILLARY
Glucose-Capillary: 94 mg/dL (ref 70–99)
Glucose-Capillary: 159 mg/dL — ABNORMAL HIGH (ref 70–99)
Glucose-Capillary: 145 mg/dL — ABNORMAL HIGH (ref 70–99)
Glucose-Capillary: 176 mg/dL — ABNORMAL HIGH (ref 70–99)

## 2019-09-03 LAB — FOLATE: Folate: 11.9 ng/mL (ref >5.9)

## 2019-09-03 LAB — CBC
HCT: 26.4 % — ABNORMAL LOW (ref 39.0–52.0)
Hemoglobin: 8.5 g/dL — ABNORMAL LOW (ref 13.0–17.0)
MCH: 28.2 pg (ref 26.0–34.0)
MCHC: 32.2 g/dL (ref 30.0–36.0)
MCV: 87.7 fL (ref 80.0–100.0)
Platelets: 113 10*3/uL — ABNORMAL LOW (ref 150–400)
RBC: 3.01 MIL/uL — ABNORMAL LOW (ref 4.22–5.81)
RDW: 17.1 % — ABNORMAL HIGH (ref 11.5–15.5)
WBC: 3.4 10*3/uL — ABNORMAL LOW (ref 4.0–10.5)
nRBC: 0 % (ref 0.0–0.2)

## 2019-09-03 LAB — RETICULOCYTES
Immature Retic Fract: 22 % — ABNORMAL HIGH (ref 2.3–15.9)
RBC.: 3.03 MIL/uL — ABNORMAL LOW (ref 4.22–5.81)
Retic Count, Absolute: 58.2 10*3/uL (ref 19.0–186.0)
Retic Ct Pct: 1.9 % (ref 0.4–3.1)

## 2019-09-03 LAB — IRON AND TIBC
Iron: 29 ug/dL — ABNORMAL LOW (ref 45–182)
Saturation Ratios: 10 % — ABNORMAL LOW (ref 17.9–39.5)
TIBC: 293 ug/dL (ref 250–450)
UIBC: 264 ug/dL

## 2019-09-03 LAB — CREATININE, URINE, RANDOM: Creatinine, Urine: 52.56 mg/dL

## 2019-09-03 LAB — APTT: aPTT: 67 seconds — ABNORMAL HIGH (ref 24–36)

## 2019-09-03 LAB — ECHOCARDIOGRAM COMPLETE
Height: 72 in
Weight: 3755.2 oz

## 2019-09-03 LAB — STREP PNEUMONIAE URINARY ANTIGEN: Strep Pneumo Urinary Antigen: NEGATIVE

## 2019-09-03 LAB — PROTIME-INR
INR: 6.6 (ref 0.8–1.2)
Prothrombin Time: 57.6 seconds — ABNORMAL HIGH (ref 11.4–15.2)

## 2019-09-03 LAB — FERRITIN: Ferritin: 53 ng/mL (ref 24–336)

## 2019-09-03 LAB — VITAMIN B12: Vitamin B-12: 321 pg/mL (ref 180–914)

## 2019-09-03 MED ORDER — SODIUM CHLORIDE 0.9 % IV SOLN
500.0000 mg | INTRAVENOUS | Status: DC
  Administered 2019-09-03 – 2019-09-05 (×3): 500 mg via INTRAVENOUS
  Filled 2019-09-03 (×3): qty 500

## 2019-09-03 MED ORDER — DARBEPOETIN ALFA 60 MCG/0.3ML IJ SOSY
60.0000 ug | PREFILLED_SYRINGE | Freq: Once | INTRAMUSCULAR | Status: AC
  Administered 2019-09-03: 60 ug via SUBCUTANEOUS
  Filled 2019-09-03: qty 0.3

## 2019-09-03 MED ORDER — VITAMIN K1 10 MG/ML IJ SOLN
2.0000 mg | Freq: Once | INTRAMUSCULAR | Status: AC
  Administered 2019-09-03: 2 mg via SUBCUTANEOUS
  Filled 2019-09-03 (×4): qty 0.2

## 2019-09-03 MED ORDER — FUROSEMIDE 10 MG/ML IJ SOLN
120.0000 mg | Freq: Three times a day (TID) | INTRAVENOUS | Status: DC
  Administered 2019-09-03 – 2019-09-07 (×13): 120 mg via INTRAVENOUS
  Filled 2019-09-03 (×3): qty 10
  Filled 2019-09-03: qty 12
  Filled 2019-09-03 (×2): qty 10
  Filled 2019-09-03 (×5): qty 12
  Filled 2019-09-03: qty 2
  Filled 2019-09-03 (×2): qty 12
  Filled 2019-09-03: qty 10
  Filled 2019-09-03: qty 12

## 2019-09-03 MED ORDER — TIZANIDINE HCL 4 MG PO TABS
4.0000 mg | ORAL_TABLET | Freq: Every day | ORAL | Status: DC
  Administered 2019-09-03 – 2019-09-07 (×5): 4 mg via ORAL
  Filled 2019-09-03 (×5): qty 1

## 2019-09-03 MED ORDER — SODIUM CHLORIDE 0.9 % IV SOLN
2.0000 g | INTRAVENOUS | Status: DC
  Administered 2019-09-03 – 2019-09-05 (×3): 2 g via INTRAVENOUS
  Filled 2019-09-03: qty 2
  Filled 2019-09-03: qty 20
  Filled 2019-09-03: qty 2
  Filled 2019-09-03: qty 20

## 2019-09-03 MED ORDER — SODIUM CHLORIDE 0.9 % IV SOLN
INTRAVENOUS | Status: DC | PRN
  Administered 2019-09-03: 250 mL via INTRAVENOUS

## 2019-09-03 MED ORDER — SODIUM CHLORIDE 0.9 % IV SOLN
510.0000 mg | Freq: Once | INTRAVENOUS | Status: AC
  Administered 2019-09-03: 510 mg via INTRAVENOUS
  Filled 2019-09-03: qty 17

## 2019-09-03 MED ORDER — ALLOPURINOL 100 MG PO TABS: 100.0000 mg | ORAL_TABLET | Freq: Every day | ORAL | Status: DC

## 2019-09-03 NOTE — Progress Notes (Signed)
PROGRESS NOTE    Chase [name]  PNT:[id number] DOB: [birth year] DOA: 09/02/2019 PCP: Mayra Neer, MD    Brief Narrative:  HPI per Dr. Rene Paci Chase [name] is a 86 y.o. male past medical history significant for chronic kidney disease stage IV,  prior cr 2.0 2013, diabetes, peripheral vascular disease, history of PE, who presents complaining of worsening shortness of breath, on and off for the last several hour month.  Patient reports shortness of breath even walking from his house to the garage, he gave out his leg he gets worsening shortness of breath and he has to stop.  He also report episode of chest pains on and off last a few minutes sharp.  Last episode couple of weeks ago.  He denies currently chest pain. He denies fever or cough.  He does report some abdominal pain and distention.  Evaluation in the ED: Sodium 142, potassium 4.3, chloride 114, CO2 13, glucose 204, BUN 115, creatinine 6.2, calcium 7.7, albumin 3.3, AST 7 ALT 11, total protein 6.4, BNP 441, troponin 94, hemoglobin 8.8, white blood cell 3.8, platelets 128, INR 6.3, SARS coronavirus 2 -.  Chest x-ray: Consolidation involving much of the right middle and lower lobes.  Bilateral pleural effusions, larger on the right than the left. Heart mildly enlarged, stable. No adenopathy.Radiopaque structure in the medial right base of uncertain etiology. This structure measures 4.4 x 2.6 cm. Its etiology is uncertain. Correlation with lateral chest radiograph potentially could be helpful for further assessment.   Assessment & Plan:   Principal Problem:   Acute on chronic respiratory failure with hypoxia (HCC) Active Problems:   Acute on chronic diastolic CHF (congestive heart failure) (HCC)   Acute renal failure superimposed on stage 4 chronic kidney disease ([place])   Long term (current) use of anticoagulants   History of pulmonary embolism   Essential hypertension   Hyperlipidemia   Chronic renal insufficiency, stage 4  (severe) (HCC)   Moderate aortic stenosis   Bilateral lower extremity edema   Renal failure   AKI (acute kidney injury) ([place])   Metabolic acidosis   CAP (community acquired pneumonia): Probable   Elevated troponin  1 acute on chronic respiratory failure with hypoxia secondary to acute on chronic diastolic CHF exacerbation Patient presenting to the ED with worsening shortness of breath with lower extremity edema bilaterally.  Patient with volume overload on examination and per x-ray findings with bilateral pleural effusions.  Concerned that patient may be having progressive worsening renal function leading to volume overload.  Patient was placed on Lasix 60 mg IV every 12.  Patient with urine output of 1.050 L over the past 24 hours.  Patient still with complaints of shortness of breath.  Increase Lasix 120 mg IV every 8 hours.  Strict I's and O's.  Daily weights.  Consult with nephrology for further evaluation and management as patient with worsening renal function and may require hemodialysis.  2.  Acute on chronic kidney disease stage IV Patient noted to have a baseline creatinine of approximately 4.  On admission creatinine was up to 6.  Patient with evidence of volume overload with lower extremity edema, shortness of breath, bilateral pleural effusion on chest x-ray, acidosis noted on labs.  Patient states his outpatient nephrologist had talked to him about hemodialysis however seems to be less inclined to go on dialysis.  Patient placed on Lasix 60 mg IV every 12 hours with a urine output of 1.050 L over the past 24 hours.  Check a urine sodium, urine creatinine, UA, renal ultrasound.  Increase Lasix to 120 mg IV every 8 hours.  Continue oral bicarb tablets.  Consult with nephrology for further evaluation and management.  3.  Community-acquired pneumonia Noted on chest x-ray and CT chest.  Patient currently afebrile.  Patient had presented with worsening shortness of breath.  Check urine  Legionella antigen, urine pneumococcus antigen, sputum Gram stain and culture.  Placed empirically on IV Rocephin and azithromycin.  Supportive care.  4.  Positive troponin/elevated troponins Patient with some complaints of chest pain that he describes as a sharp pain that has been intermittent over the past several weeks.  Patient denies any ongoing chest pain.  Cardiac enzymes with high-sensitivity troponins of 94>>:> 97.  Troponin seem to be plateauing and likely demand ischemia secondary to volume overload.  Patient denies any ongoing acute chest pain.  2D echo pending.  CT chest which was done with severe three-vessel coronary vascular calcification and aortic atherosclerosis.  Patient denies any ongoing acute chest pain.  Curb sided cardiology, likely needs outpatient follow-up.  Follow.  5.  Iron deficiency anemia/anemia of chronic disease H&H stable.  Follow.  6.  Supratherapeutic INR INR 6.3.  Repeat at 6.6.  Vitamin K ordered overnight.  Hold off on any further vitamin K and monitor INR drift down.  7.  History of PE on chronic Coumadin INR supratherapeutic.  Continue to hold Coumadin.  8.  Right hip pain Plain films negative for any fracture.  9.  Diabetes mellitus type 2 Hemoglobin A1c of 7.0.  CBG of 94 this morning.  Continue current dose of Lantus and sliding scale insulin.   DVT prophylaxis: Supratherapeutic INR. Code Status: Full Family Communication: Updated patient.  No family at bedside. Disposition Plan: To be determined.   Consultants:   Nephrology pending  Procedures:   Renal ultrasound pending 09/03/2019  2D echo pending 09/03/2019  Chest x-ray 09/02/2019  Plain films of the right hip and pelvis 09/02/2019  Antimicrobials:   IV Rocephin 09/03/2019  IV azithromycin 09/03/2019   Subjective: Still with some SOB, but states some improvement. C/O BLE pain from fluid buildup  Objective: Vitals:   09/02/19 2122 09/03/19 0341 09/03/19 0513 09/03/19 0732    BP: 126/62  131/87 123/71  Pulse: (!) 59  (!) 54 (!) 55  Resp:   20 18  Temp:   98 F (36.7 C) 98 F (36.7 C)  TempSrc:      SpO2:   (!) 84% 95%  Weight:  106.5 kg    Height:        Intake/Output Summary (Last 24 hours) at 09/03/2019 1021 Last data filed at 09/03/2019 0900 Gross per 24 hour  Intake 480 ml  Output 1050 ml  Net -570 ml   Filed Weights   09/02/19 1321 09/02/19 1850 09/03/19 0341  Weight: 99.8 kg 107.7 kg 106.5 kg    Examination:  General exam: Appears calm and comfortable  Respiratory system: Bibasilar crackles, decreased breath sounds in the bases.  No wheezing. Cardiovascular system: RRR with 3/6 SEM. 2 + pedal edema.  Gastrointestinal system: Abdomen is nondistended, soft and nontender. No organomegaly or masses felt. Normal bowel sounds heard. Central nervous system: Alert and oriented. No focal neurological deficits. Extremities: 2+ pedal edema.  Skin: Bilateral lower extremities with chronic venous stasis changes noted.   Psychiatry: Judgement and insight appear normal. Mood & affect appropriate.     Data Reviewed: I have personally reviewed following labs and imaging  studies  CBC: Recent Labs  Lab 09/02/19 1351 09/03/19 0404  WBC 3.8* 3.4*  NEUTROABS 2.5  --   HGB 8.8* 8.5*  HCT 27.7* 26.4*  MCV 89.1 87.7  PLT 128* 419*   Basic Metabolic Panel: Recent Labs  Lab 09/02/19 1351 09/03/19 0404  NA 142 144  K 4.3 4.4  CL 114* 113*  CO2 13* 15*  GLUCOSE 204* 112*  BUN 115* 116*  CREATININE 6.24* 6.16*  CALCIUM 7.7* 7.8*   GFR: Estimated Creatinine Clearance: 11.3 mL/min (A) (by C-G formula based on SCr of 6.16 mg/dL (H)). Liver Function Tests: Recent Labs  Lab 09/02/19 1351  AST 7*  ALT 11  ALKPHOS 65  BILITOT 0.7  PROT 6.4*  ALBUMIN 3.3*   No results for input(s): LIPASE, AMYLASE in the last 168 hours. No results for input(s): AMMONIA in the last 168 hours. Coagulation Profile: Recent Labs  Lab 09/02/19 1412  09/03/19 0404  INR 6.3* 6.6*   Cardiac Enzymes: No results for input(s): CKTOTAL, CKMB, CKMBINDEX, TROPONINI in the last 168 hours. BNP (last 3 results) No results for input(s): PROBNP in the last 8760 hours. HbA1C: Recent Labs    09/03/19 0404  HGBA1C 7.0*   CBG: Recent Labs  Lab 09/02/19 2215 09/03/19 0558  GLUCAP 144* 94   Lipid Profile: No results for input(s): CHOL, HDL, LDLCALC, TRIG, CHOLHDL, LDLDIRECT in the last 72 hours. Thyroid Function Tests: No results for input(s): TSH, T4TOTAL, FREET4, T3FREE, THYROIDAB in the last 72 hours. Anemia Panel: Recent Labs    09/03/19 0404  VITAMINB12 321  FOLATE 11.9  FERRITIN 53  TIBC 293  IRON 29*  RETICCTPCT 1.9   Sepsis Labs: No results for input(s): PROCALCITON, LATICACIDVEN in the last 168 hours.  Recent Results (from the past 240 hour(s))  SARS CORONAVIRUS 2 (TAT 6-24 HRS) Nasopharyngeal Nasopharyngeal Swab     Status: None   Collection Time: 09/02/19  4:34 PM   Specimen: Nasopharyngeal Swab  Result Value Ref Range Status   SARS Coronavirus 2 NEGATIVE NEGATIVE Final    Comment: (NOTE) SARS-CoV-2 target nucleic acids are NOT DETECTED. The SARS-CoV-2 RNA is generally detectable in upper and lower respiratory specimens during the acute phase of infection. Negative results do not preclude SARS-CoV-2 infection, do not rule out co-infections with other pathogens, and should not be used as the sole basis for treatment or other patient management decisions. Negative results must be combined with clinical observations, patient history, and epidemiological information. The expected result is Negative. Fact Sheet for Patients: SugarRoll.be Fact Sheet for Healthcare Providers: https://www.woods-mathews.com/ This test is not yet approved or cleared by the [place] FDA and  has been authorized for detection and/or diagnosis of SARS-CoV-2 by FDA under an Emergency Use  Authorization (EUA). This EUA will remain  in effect (meaning this test can be used) for the duration of the COVID-19 declaration under Section 56 4(b)(1) of the Act, 21 U.S.C. section 360bbb-3(b)(1), unless the authorization is terminated or revoked sooner. Performed at [facility], [place] [address], [place], [place] [address]          Radiology Studies: CT CHEST WO CONTRAST  Result Date: 09/02/2019 CLINICAL DATA:  86 year old male with chest pain and increased lower extremity edema. EXAM: CT CHEST WITHOUT CONTRAST TECHNIQUE: Multidetector CT imaging of the chest was performed following the standard protocol without IV contrast. COMPARISON:  Chest radiograph dated 09/01/2019. FINDINGS: Evaluation of this exam is limited in the absence of intravenous contrast. Cardiovascular:  Top-normal cardiac size. No pericardial effusion. Advanced 3 vessel coronary vascular calcification. There is moderate atherosclerotic calcification of the thoracic aorta. The central pulmonary arteries are grossly unremarkable on this noncontrast CT. Mediastinum/Nodes: No hilar or mediastinal adenopathy. Evaluation however is limited in the absence of intravenous contrast and secondary to consolidative changes of the right lung. The esophagus is grossly unremarkable. No mediastinal fluid collection. Lungs/Pleura: There is a moderate right and small left pleural effusions. There is near complete consolidative changes of the right lower lobe which may represent atelectasis or infiltrate. Underlying mass is not excluded. Clinical correlation and follow-up to resolution recommended. Left lung base and lingular densities noted which are concerning for infiltrate. There is no pneumothorax. The central airways are patent. Upper Abdomen: Mild diffuse upper abdominal stranding and edema. Irregular liver contour may represent changes of cirrhosis. Cholecystectomy. Musculoskeletal: Osteopenia with degenerative changes of the spine  and enthesopathy likely representing ankylosing spondylitis. Lower cervical ACDF. No acute osseous pathology. IMPRESSION: 1. Moderate right and small left pleural effusions. 2. Near complete consolidative changes of the right lower lobe as well as areas of airspace opacity in the left lower lobe and lingula may represent atelectasis or pneumonia. Clinical correlation and follow-up recommended. 3. Severe 3 vessel coronary vascular calcification and Aortic Atherosclerosis (ICD10-I70.0). Electronically Signed   By: Anner Crete M.D.   On: 09/02/2019 22:14   DG Chest Portable 1 View  Result Date: 09/02/2019 CLINICAL DATA:  Shortness of breath EXAM: PORTABLE CHEST 1 VIEW COMPARISON:  October 22, 2018 FINDINGS: There is a pleural effusion on each side, larger on the right than the left. There is consolidation throughout much of the right middle and lower lobes. There is slight atelectasis in the left base. Heart is mildly enlarged with pulmonary vascularity normal. No adenopathy. There is postoperative change in the lower cervical spine. There is a radiopaque appearing structure in the right base region medially of uncertain etiology. IMPRESSION: Consolidation involving much of the right middle and lower lobes. Bilateral pleural effusions, larger on the right than the left. Heart mildly enlarged, stable. No adenopathy. Radiopaque structure in the medial right base of uncertain etiology. This structure measures 4.4 x 2.6 cm. Its etiology is uncertain. Correlation with lateral chest radiograph potentially could be helpful for further assessment. Electronically Signed   By: Lowella Grip III M.D.   On: 09/02/2019 14:31   DG HIP UNILAT WITH PELVIS 1V RIGHT  Result Date: 09/02/2019 CLINICAL DATA:  Pain and swelling EXAM: DG HIP (WITH OR WITHOUT PELVIS) 1V RIGHT COMPARISON:  10/13/2009, CT 02/12/2012 FINDINGS: SI joints are non widened. Extensive vascular calcifications. Pubic symphysis and rami are intact.  Moderate severe arthritis left hip. Prior right hip replacement with intact hardware and normal alignment. IMPRESSION: Prior right hip replacement.  No acute osseous abnormality. Electronically Signed   By: Donavan Foil M.D.   On: 09/02/2019 20:19        Scheduled Meds: . calcitRIOL  0.25 mcg Oral QODAY  . insulin aspart  0-6 Units Subcutaneous TID WC  . insulin glargine  20 Units Subcutaneous Daily  . phytonadione  2 mg Subcutaneous Once  . sodium bicarbonate  1,300 mg Oral TID  . tamsulosin  0.4 mg Oral QPC supper  . tiZANidine  4 mg Oral QHS  . Warfarin - Pharmacist Dosing Inpatient   Does not apply q1800   Continuous Infusions: . azithromycin    . cefTRIAXone (ROCEPHIN)  IV    . furosemide  LOS: 1 day    Time spent: 40 minutes    Irine Seal, MD [facility]  If 7PM-7AM, please contact night-coverage [email or web address] 09/03/2019, 10:21 AM

## 2019-09-03 NOTE — Consult Note (Signed)
Chase [name] Admit Date: 09/02/2019 09/03/2019 Chase [name] Requesting Physician:  Grandville Silos MD  Reason for Consult:  Renal Failure, Pulm Edema/Pleural Effusions, SOB HPI:  73M admitted overnight after presenting with subacute progressive dyspnea, with orthopnea and exertional component.  Has a past history including diastolic heart failure DM2, PVD, chronic venous stasis, history of PE.  Work-up revealed bilateral pleural effusions and imaging of the chest showed that the right was greater in the left and he had opacification of the bibasilar lung fields.  He has been admitted and placed on ceftriaxone/azithromycin, and IV diuresis.  He has progressive CKD with a creatinine on 07/21/2019 4.75 when seen at primary care and follows in our office with Dr. Hollie Salk.  From those notes they have discussed dialysis, his wife is on dialysis at [facility], and he has expressed clearly to her that he would not pursue dialysis if it came to that.  His presenting creatinine here was 6.24 and today it is 6.16 with a BUN of 112, K4.4, HCO3 15.  He has made 1.3 L of urine since admission.  He had a renal ultrasound at admission showing a 9.1 cm right and 9.7 cm left kidney without evidence of hydronephrosis.  He had significant cortical thinning consistent with progressive CKD.  Urinalysis with significant hematuria, trace proteinuria, some pyuria.  Appetite is fair. No N/V, No dysgeusia.   In addition to IV antibiotics he is currently receiving Lasix 120 mg IV every 8 hours.  He feels somewhat improved but has not been out of bed yet.  We explored his previous conversations with Dr. Hollie Salk.  To me he is exceedingly clear that he has no interest in pursuing dialysis if it comes to that.  He feels he is let a good life and now he is becoming increasingly dependent upon others and feels like a burden.  This is not a type of life he wants to live.  He hopes that we can reverse his acute issues and stabilize him for discharge but  if not he would be ready for hospice.   Creatinine, Ser (mg/dL)  Date Value  09/03/2019 6.16 (H)  09/02/2019 6.24 (H)  05/01/2012 2.00 (H)  03/06/2011 1.95 (H)  03/22/2010 1.49  03/21/2010 1.57 (H)  03/20/2010 1.76 (H)  03/19/2010 1.80 (H)  03/19/2010 1.91 (H)  10/23/2009 1.78 (H)  ] I/Os: I/O last 3 completed shifts: In: 240 [P.O.:240] Out: 1050 [Urine:1050]   ROS NSAIDS: denies use IV Contrast no exposure TMP/SMX no exposure Hypotension not present Balance of 12 systems is negative w/ exceptions as above  PMH  Past Medical History:  Diagnosis Date  . Arthritis   . Chronic kidney disease    DECREASED KIDNEY FUNCT, DR [place]  . Complication of anesthesia    BOWELS "QUIT" DECREASED KIDNEY FXN S\P HIP REPLACEMENT AT [facility] 2011   . Diabetes mellitus   . Dizziness - light-headed   . Hypertension   . Neuromuscular disorder (HCC)    NUMB/TINGLING ARMS, LEGS, FEET   . Peripheral vascular disease ([place])   . Pulmonary embolus ([place])    1970S + 1999  . Sleep apnea    USES CPAP  AT NIGHT  . Swelling of both ankles    DISCOLORATION  (PURPLE / RED)     PSH  Past Surgical History:  Procedure Laterality Date  . ANTERIOR CERVICAL DECOMP/DISCECTOMY FUSION  05/07/2012   Procedure: ANTERIOR CERVICAL DECOMPRESSION/DISCECTOMY FUSION 3 LEVELS;  Surgeon: Kristeen Miss, MD;  Location: [facility]  [facility];  Service: Neurosurgery;  Laterality: N/A;  Cervical four - five, five- six, six- seven  Anterior cervical decompression/diskectomy/fusion  . APPENDECTOMY    . CARDIAC CATHETERIZATION     [facility] DR BERRY YRS AGO   . CARPAL TUNNEL RELEASE     RIGHT HAND  2012   . CHOLECYSTECTOMY    . ELBOW SURGERY     LEFT  . HEMORROIDECTOMY    . JOINT REPLACEMENT     2011 RT HIP REPLACEMENT  . KNEE ARTHROSCOPY     BILAT   FH  Family History  Problem Relation Age of Onset  . Heart attack Mother   . Heart attack Father   . Colon cancer Father    SH  reports that he has quit smoking. He has never  used smokeless tobacco. He reports that he does not drink alcohol or use drugs. Allergies  Allergies  Allergen Reactions  . Sulfa Antibiotics Other (See Comments)    From childhood- reaction??   Home medications Prior to Admission medications   Medication Sig Start Date End Date Taking? Authorizing Provider  amLODipine (NORVASC) 10 MG tablet Take 10 mg by mouth at bedtime.    Yes [provider]  amoxicillin (AMOXIL) 500 MG capsule Take 2,000 mg by mouth See admin instructions. Take 2,000 mg by mouth one hour prior to dental appointments   Yes [provider]  diphenhydrAMINE (BENADRYL) 25 MG tablet Take 25 mg by mouth at bedtime.   Yes [provider]  furosemide (LASIX) 40 MG tablet Take 60-80 mg by mouth every morning.    Yes [provider]  glimepiride (AMARYL) 4 MG tablet Take 2 mg by mouth daily as needed (if insulin is not taken).    Yes [provider]  HYDROcodone-acetaminophen (NORCO) 7.5-325 MG tablet Take 1 tablet by mouth 3 (three) times daily as needed (for pain).    Yes [provider]  indomethacin (INDOCIN) 25 MG capsule Take 25 mg by mouth daily as needed for mild pain.   Yes [provider]  Insulin Glargine (LANTUS SOLOSTAR) 100 UNIT/ML Solostar Pen Inject 30-40 Units into the skin daily before breakfast.    Yes [provider]  metoprolol tartrate (LOPRESSOR) 50 MG tablet Take 50 mg by mouth 2 (two) times daily.   Yes [provider]  Tamsulosin HCl (FLOMAX) 0.4 MG CAPS Take 0.4 mg by mouth daily.    Yes [provider]  tiZANidine (ZANAFLEX) 4 MG tablet Take 4 mg by mouth at bedtime.   Yes [provider]  warfarin (COUMADIN) 5 MG tablet TAKE 1 & 1/2 (ONE & ONE-HALF) TABLETS BY MOUTH ONCE DAILY OR  AS  DIRECTED Patient taking differently: Take 5 mg by mouth at bedtime.  07/01/19  Yes Lorretta Harp, MD  allopurinol (ZYLOPRIM) 100 MG tablet Take 100 mg by mouth daily.  05/14/19   [provider]  calcitRIOL (ROCALTROL) 0.25 MCG capsule Take 0.25 mcg by mouth every other day. 07/14/19   [provider]  finasteride (PROSCAR) 5 MG tablet Take 5 mg by mouth daily. 12/09/13   [provider]    Current Medications Scheduled Meds: . calcitRIOL  0.25 mcg Oral QODAY  . insulin aspart  0-6 Units Subcutaneous TID WC  . insulin glargine  20 Units Subcutaneous Daily  . phytonadione  2 mg Subcutaneous Once  . sodium bicarbonate  1,300 mg Oral TID  . tamsulosin  0.4 mg Oral QPC supper  . tiZANidine  4 mg Oral QHS  . Warfarin - Pharmacist Dosing Inpatient   Does not apply q1800   Continuous Infusions: . sodium chloride 250 mL (09/03/19 1056)  . azithromycin 500 mg (09/03/19 1240)  . cefTRIAXone (ROCEPHIN)  IV 2 g (09/03/19 1058)  . furosemide 120 mg (09/03/19 1048)   PRN Meds:.sodium chloride, acetaminophen **OR** acetaminophen, HYDROcodone-acetaminophen, nitroGLYCERIN, ondansetron **OR** ondansetron (ZOFRAN) IV  CBC Recent Labs  Lab 09/02/19 1351 09/03/19 0404  WBC 3.8* 3.4*  NEUTROABS 2.5  --   HGB 8.8* 8.5*  HCT 27.7* 26.4*  MCV 89.1 87.7  PLT 128* 578*   Basic Metabolic Panel Recent Labs  Lab 09/02/19 1351 09/03/19 0404  NA 142 144  K 4.3 4.4  CL 114* 113*  CO2 13* 15*  GLUCOSE 204* 112*  BUN 115* 116*  CREATININE 6.24* 6.16*  CALCIUM 7.7* 7.8*   Results for MISSAEL, FERRARI (MRN [id number]) as of 09/03/2019 13:58  Ref. Range 09/03/2019 04:04  Saturation Ratios Latest Ref Range: 17.9 - 39.5 % 10 (L)  Ferritin Latest Ref Range: 24 - 336 ng/mL 53   Physical Exam  Blood pressure (!) 122/50, pulse 61, temperature 98 F (36.7 C), resp. rate 18, height 6' (1.829 m), weight 106.5 kg, SpO2 95 %. GEN: NAD, elderly male, conversant, nl wob ENT: NCAt EYES: EOMI CV: regular, nl s1s2 no rub PULM: bibasilar rhonchi, clear in upper air fields ABD: s/nt/nd SKIN:  Chronic venous stasis changes / hyperpigmentation in  LEs EXT:3+ LEE   Assessment 7M with progressive CKD5 presenting with dCHF / Hypervolemia / pleural effusions, ? CAP, worsened renal failure.  1. Progressive CKD5, follows at [facility] with [facility] 2. dCHF exacerbation / hypervolemia / pleural effusion / pulmonary edema 3. ? CAP on CTX/Azithromycin, could be all #2 4. AFib on warfarin 5. Hx/o PE 6. Chronic venous statsis 7. DM2 8. Anemia with features of IDA and due to #1 9. Metabolic Acidosis on NaHCo3  Plan 1. He would like to try to stabilize his breathing and functional status.  He has no interest in pursuing short or long-term dialysis which is a very reasonable choice.  I would continue with the high-dose of IV Lasix and treatment with antibiotics. 2. Probably would benefit from a higher hemoglobin, will provide ESA and IV iron. 3. If he fails to improve would transition to hospice 4. I would go ahead and involve palliative care at the current time 5. We will closely follow along 6. Daily weights, Daily Renal Panel, Strict I/Os, Avoid nephrotoxins (NSAIDs, judicious IV Contrast)    Chase [name]  [phone] 09/03/2019, 1:51 PM

## 2019-09-03 NOTE — Progress Notes (Signed)
  Echocardiogram 2D Echocardiogram has been performed.  Chase [name] 09/03/2019, 10:23 AM

## 2019-09-03 NOTE — Progress Notes (Signed)
Chase [name] for warfarin Indication: history of VTE   Patient Measurements: Height: 6' (182.9 cm) Weight: 234 lb 11.2 oz (106.5 kg) IBW/kg (Calculated) : 77.6   Vital Signs: Temp: 98 F (36.7 C) (01/20 0732) BP: 123/71 (01/20 0732) Pulse Rate: 55 (01/20 0732)  Labs: Recent Labs    09/02/19 1351 09/02/19 1412 09/02/19 1733 09/03/19 0404  HGB 8.8*  --   --  8.5*  HCT 27.7*  --   --  26.4*  PLT 128*  --   --  113*  APTT  --   --   --  67*  LABPROT  --  55.7*  --  57.6*  INR  --  6.3*  --  6.6*  CREATININE 6.24*  --   --  6.16*  TROPONINIHS 94*  --  97*  --     Assessment:  86 yo male admitted with SOB. Pt is on warfarin for hx of VTE.  His INR is supratherapeutic at 6.6 today with no associated bleeding. Vitamin K sq 2 mg x 1 today  PTA warfarin dose is 5 mg po daily.    Goal of Therapy:  INR 2-3 Monitor platelets by anticoagulation protocol: Yes   Plan:  -Hold warfarin -Resume when INR is appropriate  Thank you Anette Guarneri, PharmD 09/03/2019,8:18 AM

## 2019-09-03 NOTE — Progress Notes (Signed)
  Echocardiogram 2D Echocardiogram has been attempted. Patient walking with PT. Will reattempt at later time.  Chase [name] 09/03/2019, 8:55 AM

## 2019-09-04 LAB — RENAL FUNCTION PANEL
Albumin: 3.2 g/dL — ABNORMAL LOW (ref 3.5–5.0)
Anion gap: 17 — ABNORMAL HIGH (ref 5–15)
BUN: 114 mg/dL — ABNORMAL HIGH (ref 8–23)
CO2: 15 mmol/L — ABNORMAL LOW (ref 22–32)
Calcium: 7.8 mg/dL — ABNORMAL LOW (ref 8.9–10.3)
Chloride: 116 mmol/L — ABNORMAL HIGH (ref 98–111)
Creatinine, Ser: 5.84 mg/dL — ABNORMAL HIGH (ref 0.61–1.24)
GFR calc Af Amer: 9 mL/min — ABNORMAL LOW (ref >60)
GFR calc non Af Amer: 8 mL/min — ABNORMAL LOW (ref >60)
Glucose, Bld: 119 mg/dL — ABNORMAL HIGH (ref 70–99)
Phosphorus: 9.8 mg/dL — ABNORMAL HIGH (ref 2.5–4.6)
Potassium: 4.3 mmol/L (ref 3.5–5.1)
Sodium: 148 mmol/L — ABNORMAL HIGH (ref 135–145)

## 2019-09-04 LAB — URINE CULTURE: Culture: NO GROWTH

## 2019-09-04 LAB — GLUCOSE, CAPILLARY
Glucose-Capillary: 96 mg/dL (ref 70–99)
Glucose-Capillary: 124 mg/dL — ABNORMAL HIGH (ref 70–99)
Glucose-Capillary: 114 mg/dL — ABNORMAL HIGH (ref 70–99)
Glucose-Capillary: 143 mg/dL — ABNORMAL HIGH (ref 70–99)

## 2019-09-04 LAB — CBC WITH DIFFERENTIAL/PLATELET
Abs Immature Granulocytes: 0.01 10*3/uL (ref 0.00–0.07)
Basophils Absolute: 0 10*3/uL (ref 0.0–0.1)
Basophils Relative: 1 %
Eosinophils Absolute: 0.1 10*3/uL (ref 0.0–0.5)
Eosinophils Relative: 3 %
HCT: 27.4 % — ABNORMAL LOW (ref 39.0–52.0)
Hemoglobin: 8.3 g/dL — ABNORMAL LOW (ref 13.0–17.0)
Immature Granulocytes: 0 %
Lymphocytes Relative: 24 %
Lymphs Abs: 1 10*3/uL (ref 0.7–4.0)
MCH: 27.1 pg (ref 26.0–34.0)
MCHC: 30.3 g/dL (ref 30.0–36.0)
MCV: 89.5 fL (ref 80.0–100.0)
Monocytes Absolute: 0.5 10*3/uL (ref 0.1–1.0)
Monocytes Relative: 12 %
Neutro Abs: 2.5 10*3/uL (ref 1.7–7.7)
Neutrophils Relative %: 60 %
Platelets: 114 10*3/uL — ABNORMAL LOW (ref 150–400)
RBC: 3.06 MIL/uL — ABNORMAL LOW (ref 4.22–5.81)
RDW: 17.4 % — ABNORMAL HIGH (ref 11.5–15.5)
WBC: 4 10*3/uL (ref 4.0–10.5)
nRBC: 0 % (ref 0.0–0.2)

## 2019-09-04 LAB — INFLUENZA PANEL BY PCR (TYPE A & B)
Influenza A By PCR: NEGATIVE
Influenza B By PCR: NEGATIVE

## 2019-09-04 LAB — PROTIME-INR
INR: 6 (ref 0.8–1.2)
Prothrombin Time: 53.6 seconds — ABNORMAL HIGH (ref 11.4–15.2)

## 2019-09-04 MED ORDER — VITAMIN K1 10 MG/ML IJ SOLN
2.0000 mg | Freq: Once | INTRAMUSCULAR | Status: AC
  Administered 2019-09-04: 2 mg via SUBCUTANEOUS
  Filled 2019-09-04: qty 0.2

## 2019-09-04 NOTE — Progress Notes (Addendum)
ANTICOAGULATION CONSULT NOTE  Pharmacy Consult for warfarin Indication: history of VTE   Patient Measurements: Height: 6' (182.9 cm) Weight: 232 lb 14.4 oz (105.6 kg) IBW/kg (Calculated) : 77.6   Vital Signs: Temp: 97.3 F (36.3 C) (01/21 0504) Temp Source: Oral (01/21 0504) BP: 118/68 (01/21 0504) Pulse Rate: 78 (01/21 0504)  Labs: Recent Labs    09/02/19 1351 09/02/19 1351 09/02/19 1412 09/02/19 1733 09/03/19 0404 09/04/19 0411  HGB 8.8*   < >  --   --  8.5* 8.3*  HCT 27.7*  --   --   --  26.4* 27.4*  PLT 128*  --   --   --  113* 114*  APTT  --   --   --   --  67*  --   LABPROT  --   --  55.7*  --  57.6* 53.6*  INR  --   --  6.3*  --  6.6* 6.0*  CREATININE 6.24*  --   --   --  6.16* 5.84*  TROPONINIHS 94*  --   --  97*  --   --    < > = values in this interval not displayed.    Assessment: 86 yo male admitted with SOB. Pt is on warfarin for hx of VTE.  His INR is supratherapeutic at 6.6 today with no associated bleeding. Vitamin K sq 2 mg on 1/20 and 1/21. HF and antibiotics likely contributing to elevated INR -INR= 6   PTA warfarin dose is 5 mg po daily.    Goal of Therapy:  INR 2-3 Monitor platelets by anticoagulation protocol: Yes   Plan:  -Hold warfarin -Daily PT/INR -If using vitamin K, would consider po (absorption of [place] can be somewhat erratic)  Hildred Laser, PharmD Clinical Pharmacist **Pharmacist phone directory can now be found on [email or web address] ([email or web address]).  Listed under [place].

## 2019-09-04 NOTE — Progress Notes (Addendum)
Admit: 09/02/2019 LOS: 2  42M with progressive CKD5 presenting with dCHF / Hypervolemia / pleural effusions, ? CAP, worsened renal failure.  Subjective:  . No interval events, he thinks he might feel better, has not been out of bed . Not much urine output but weight is down 0.9 kg . Creatinine with slight improvement, BUN stable, K5.3 . Received Aranesp and Feraheme yesterday . Has supratherapeutic INR  01/20 0701 - 01/21 0700 In: 960 [P.O.:960] Out: 450 [Urine:450]  Filed Weights   09/02/19 1850 09/03/19 0341 09/04/19 0458  Weight: 107.7 kg 106.5 kg 105.6 kg    Scheduled Meds: . calcitRIOL  0.25 mcg Oral QODAY  . insulin aspart  0-6 Units Subcutaneous TID WC  . insulin glargine  20 Units Subcutaneous Daily  . sodium bicarbonate  1,300 mg Oral TID  . tamsulosin  0.4 mg Oral QPC supper  . tiZANidine  4 mg Oral QHS  . Warfarin - Pharmacist Dosing Inpatient   Does not apply q1800   Continuous Infusions: . sodium chloride 250 mL (09/03/19 1056)  . azithromycin 500 mg (09/04/19 0831)  . cefTRIAXone (ROCEPHIN)  IV 2 g (09/03/19 1058)  . furosemide 120 mg (09/04/19 0525)   PRN Meds:.sodium chloride, acetaminophen **OR** acetaminophen, HYDROcodone-acetaminophen, nitroGLYCERIN, ondansetron **OR** ondansetron (ZOFRAN) IV  Current Labs: reviewed    Physical Exam:  Blood pressure 127/60, pulse 77, temperature (!) 97.5 F (36.4 C), temperature source Oral, resp. rate 20, height 6' (1.829 m), weight 105.6 kg, SpO2 94 %. GEN: NAD, elderly male, conversant, nl wob ENT: NCAt EYES: EOMI CV: regular, nl s1s2 no rub PULM: bibasilar rhonchi, clear in upper air fields ABD: s/nt/nd SKIN:  Chronic venous stasis changes / hyperpigmentation in LEs EXT:3+ LEE  A 1. Progressive CKD5, follows at [facility] with [facility] 2. dCHF exacerbation / hypervolemia / pleural effusion / pulmonary edema 3. ? CAP on CTX/Azithromycin, could be all #2 4. AFib on warfarin 5. Hx/o PE 6. Chronic venous  statsis 7. DM2 8. Anemia with features of IDA and due to #1, status post ESA and IV iron 1/20 9. Metabolic Acidosis on NaHCo3 10. Supratherapeutic INR per TRH  P 1. He would like to try to stabilize his breathing and functional status.  He has no interest in pursuing short or long-term dialysis which is a very reasonable choice.  Continue with the high-dose of IV Lasix and treatment with antibiotics. 2. If he fails to improve would transition to hospice 3. I would go ahead and involve palliative care at the current time  4. Will need PT/OT 5. We will closely follow along. 6. Daily weights, Daily Renal Panel, Strict I/Os, Avoid nephrotoxins (NSAIDs, judicious IV Contrast)     Pearson Grippe MD 09/04/2019, 11:46 AM  Recent Labs  Lab 09/02/19 1351 09/03/19 0404 09/04/19 0411  NA 142 144 148*  K 4.3 4.4 4.3  CL 114* 113* 116*  CO2 13* 15* 15*  GLUCOSE 204* 112* 119*  BUN 115* 116* 114*  CREATININE 6.24* 6.16* 5.84*  CALCIUM 7.7* 7.8* 7.8*  PHOS  --   --  9.8*   Recent Labs  Lab 09/02/19 1351 09/03/19 0404 09/04/19 0411  WBC 3.8* 3.4* 4.0  NEUTROABS 2.5  --  2.5  HGB 8.8* 8.5* 8.3*  HCT 27.7* 26.4* 27.4*  MCV 89.1 87.7 89.5  PLT 128* 113* 114*

## 2019-09-04 NOTE — Progress Notes (Signed)
PROGRESS NOTE    Chase [name]  YTK:[id number] DOB: Jul 02, 1933 DOA: 09/02/2019 PCP: Mayra Neer, MD    Brief Narrative:  HPI per Dr. Rene Paci Chase [name] is a 86 y.o. male past medical history significant for chronic kidney disease stage IV,  prior cr 2.0 2013, diabetes, peripheral vascular disease, history of PE, who presents complaining of worsening shortness of breath, on and off for the last several hour month.  Patient reports shortness of breath even walking from his house to the garage, he gave out his leg he gets worsening shortness of breath and he has to stop.  He also report episode of chest pains on and off last a few minutes sharp.  Last episode couple of weeks ago.  He denies currently chest pain. He denies fever or cough.  He does report some abdominal pain and distention.  Evaluation in the ED: Sodium 142, potassium 4.3, chloride 114, CO2 13, glucose 204, BUN 115, creatinine 6.2, calcium 7.7, albumin 3.3, AST 7 ALT 11, total protein 6.4, BNP 441, troponin 94, hemoglobin 8.8, white blood cell 3.8, platelets 128, INR 6.3, SARS coronavirus 2 -.  Chest x-ray: Consolidation involving much of the right middle and lower lobes.  Bilateral pleural effusions, larger on the right than the left. Heart mildly enlarged, stable. No adenopathy.Radiopaque structure in the medial right base of uncertain etiology. This structure measures 4.4 x 2.6 cm. Its etiology is uncertain. Correlation with lateral chest radiograph potentially could be helpful for further assessment.   Assessment & Plan:   Principal Problem:   Acute on chronic respiratory failure with hypoxia (HCC) Active Problems:   Acute on chronic diastolic CHF (congestive heart failure) (HCC)   Acute renal failure superimposed on stage 4 chronic kidney disease ([place])   Long term (current) use of anticoagulants   History of pulmonary embolism   Essential hypertension   Hyperlipidemia   Chronic renal insufficiency, stage 4  (severe) (HCC)   Moderate aortic stenosis   Bilateral lower extremity edema   Renal failure   AKI (acute kidney injury) ([place])   Metabolic acidosis   CAP (community acquired pneumonia): Probable   Elevated troponin   Supratherapeutic INR  1 acute on chronic respiratory failure with hypoxia secondary to acute on chronic diastolic CHF exacerbation Patient presenting to the ED with worsening shortness of breath with lower extremity edema bilaterally.  Patient with volume overload on examination and per x-ray findings with bilateral pleural effusions.  Concerned that patient may be having progressive worsening renal function leading to volume overload.  Patient was placed on Lasix 60 mg IV every 12 with poor urine output and Lasix increased to 120 mg IV every 12 hours per nephrology recommendations.  Patient still with poor urine output however patient states some clinical improvement.  Patient has been seen by nephrology and patient noted to have no interest in pursuing short or long-term dialysis.  Continue current dose of IV Lasix and if no significant improvement may need to transition to hospice.  Palliative care consultation pending.  Nephrology following and appreciate input and recommendations.  2.  Acute on chronic kidney disease stage IV Patient noted to have a baseline creatinine of approximately 4.  On admission creatinine was up to 6.  Patient with evidence of volume overload with lower extremity edema, shortness of breath, bilateral pleural effusion on chest x-ray, acidosis noted on labs.  Patient states his outpatient nephrologist had talked to him about hemodialysis however seems to be less inclined  to go on dialysis.  Patient initially was on Lasix 60 mg IV every 12 hours with no significant urine output and Lasix increased to 120 mg IV every 8 hours with urine output that was recorded of 450 cc over the past 24 hours.  Current weight of 105.6 kg from 106.5 kg from 107.7 kg from 99.8 kg.   Patient still acidotic and on bicarb tablets.  Patient seen in consultation by nephrology patient has no interest in pursuing short-term dialysis and as such we will continue with high-dose IV Lasix and antibiotic treatment.  Per nephrology if patient fails to improve will likely need to transition to hospice.  Palliative care has been consulted and palliative care consultation pending.  Nephrology following and appreciate input and recommendations.  3.  Community-acquired pneumonia Noted on chest x-ray and CT chest.  Patient currently afebrile.  Patient had presented with worsening shortness of breath.  Urine strep pneumococcus antigen negative.  Urine Legionella antigen pending.  Sputum Gram stain and cultures pending.  Continue empiric IV Rocephin and azithromycin.  Supportive care.   4.  Positive troponin/elevated troponins Patient with some complaints of chest pain that he describes as a sharp pain that has been intermittent over the past several weeks.  Patient denies any ongoing chest pain.  Cardiac enzymes with high-sensitivity troponins of 94>>:> 97.  Troponin seem to be plateauing and likely demand ischemia secondary to volume overload.  Patient denies any ongoing acute chest pain.  2D echo with a EF of 65 to 70%, moderately increased LVH, grade 2 diastolic dysfunction, no wall motion abnormalities, severe thickening of aortic valve and severe calcification of aortic valve.  CT chest which was done with severe three-vessel coronary vascular calcification and aortic atherosclerosis.  Patient denies any ongoing acute chest pain.  Curb sided cardiology, likely needs outpatient follow-up.  Follow.  5.  Iron deficiency anemia/anemia of chronic disease H&H stable at 8.3.  Status post iron absent IV Feraheme per nephrology.  Follow.  6.  Supratherapeutic INR INR 6.0 today.  Status post vitamin K.  Patient with no overt bleeding.  Monitor INR drift downwards.  Continue to hold Coumadin.    7.   History of PE on chronic Coumadin INR supratherapeutic.  Patient denies any overt bleeding.  Continue to hold Coumadin.  Coumadin per pharmacy.  8.  Right hip pain Plain films negative for any fracture.  9.  Diabetes mellitus type 2 Hemoglobin A1c of 7.0.  CBG of 96 this morning.  Continue current dose of Lantus and sliding scale insulin.   DVT prophylaxis: Supratherapeutic INR. Code Status: Full Family Communication: Updated patient.  No family at bedside. Disposition Plan: To be determined.   Consultants:   Nephrology: Dr. Joelyn Oms 09/03/2019  Palliative care pending  Procedures:   Renal ultrasound 09/03/2019  2D echo 09/03/2019  Chest x-ray 09/02/2019  Plain films of the right hip and pelvis 09/02/2019  Antimicrobials:   IV Rocephin 09/03/2019  IV azithromycin 09/03/2019   Subjective: Patient states some improvement with shortness of breath.  Stated had a bowel movement this morning.  States has been having some good urine output.    Objective: Vitals:   09/03/19 1643 09/03/19 2011 09/04/19 0458 09/04/19 0504  BP: 125/68 112/68  118/68  Pulse: 63 64  78  Resp: 18 20  20   Temp: (!) 97.5 F (36.4 C)   (!) 97.3 F (36.3 C)  TempSrc: Oral   Oral  SpO2: 97% 97%  96%  Weight:  105.6 kg   Height:        Intake/Output Summary (Last 24 hours) at 09/04/2019 1027 Last data filed at 09/04/2019 0600 Gross per 24 hour  Intake 720 ml  Output 450 ml  Net 270 ml   Filed Weights   09/02/19 1850 09/03/19 0341 09/04/19 0458  Weight: 107.7 kg 106.5 kg 105.6 kg    Examination:  General exam: Appears calm and comfortable. Respiratory system: Decreased bibasilar crackles.  Decreased breath sounds in the bases.  No wheezing.  Cardiovascular system: RRR with 3/6 SEM. 2 + pedal edema.  Gastrointestinal system: Abdomen is, nontender, nondistended, positive bowel sounds.  No rebound.  No guarding. Central nervous system: Alert and oriented. No focal neurological  deficits. Extremities: 2+ pedal edema.  Skin: Bilateral lower extremities with chronic venous stasis changes noted.   Psychiatry: Judgement and insight appear normal. Mood & affect appropriate.     Data Reviewed: I have personally reviewed following labs and imaging studies  CBC: Recent Labs  Lab 09/02/19 1351 09/03/19 0404 09/04/19 0411  WBC 3.8* 3.4* 4.0  NEUTROABS 2.5  --  2.5  HGB 8.8* 8.5* 8.3*  HCT 27.7* 26.4* 27.4*  MCV 89.1 87.7 89.5  PLT 128* 113* 937*   Basic Metabolic Panel: Recent Labs  Lab 09/02/19 1351 09/03/19 0404 09/04/19 0411  NA 142 144 148*  K 4.3 4.4 4.3  CL 114* 113* 116*  CO2 13* 15* 15*  GLUCOSE 204* 112* 119*  BUN 115* 116* 114*  CREATININE 6.24* 6.16* 5.84*  CALCIUM 7.7* 7.8* 7.8*  PHOS  --   --  9.8*   GFR: Estimated Creatinine Clearance: 11.8 mL/min (A) (by C-G formula based on SCr of 5.84 mg/dL (H)). Liver Function Tests: Recent Labs  Lab 09/02/19 1351 09/04/19 0411  AST 7*  --   ALT 11  --   ALKPHOS 65  --   BILITOT 0.7  --   PROT 6.4*  --   ALBUMIN 3.3* 3.2*   No results for input(s): LIPASE, AMYLASE in the last 168 hours. No results for input(s): AMMONIA in the last 168 hours. Coagulation Profile: Recent Labs  Lab 09/02/19 1412 09/03/19 0404 09/04/19 0411  INR 6.3* 6.6* 6.0*   Cardiac Enzymes: No results for input(s): CKTOTAL, CKMB, CKMBINDEX, TROPONINI in the last 168 hours. BNP (last 3 results) No results for input(s): PROBNP in the last 8760 hours. HbA1C: Recent Labs    09/03/19 0404  HGBA1C 7.0*   CBG: Recent Labs  Lab 09/03/19 0558 09/03/19 1113 09/03/19 1645 09/03/19 2137 09/04/19 0632  GLUCAP 94 159* 145* 176* 96   Lipid Profile: No results for input(s): CHOL, HDL, LDLCALC, TRIG, CHOLHDL, LDLDIRECT in the last 72 hours. Thyroid Function Tests: No results for input(s): TSH, T4TOTAL, FREET4, T3FREE, THYROIDAB in the last 72 hours. Anemia Panel: Recent Labs    09/03/19 0404  VITAMINB12 321   FOLATE 11.9  FERRITIN 53  TIBC 293  IRON 29*  RETICCTPCT 1.9   Sepsis Labs: No results for input(s): PROCALCITON, LATICACIDVEN in the last 168 hours.  Recent Results (from the past 240 hour(s))  SARS CORONAVIRUS 2 (TAT 6-24 HRS) Nasopharyngeal Nasopharyngeal Swab     Status: None   Collection Time: 09/02/19  4:34 PM   Specimen: Nasopharyngeal Swab  Result Value Ref Range Status   SARS Coronavirus 2 NEGATIVE NEGATIVE Final    Comment: (NOTE) SARS-CoV-2 target nucleic acids are NOT DETECTED. The SARS-CoV-2 RNA is generally detectable in upper and lower respiratory specimens  during the acute phase of infection. Negative results do not preclude SARS-CoV-2 infection, do not rule out co-infections with other pathogens, and should not be used as the sole basis for treatment or other patient management decisions. Negative results must be combined with clinical observations, patient history, and epidemiological information. The expected result is Negative. Fact Sheet for Patients: SugarRoll.be Fact Sheet for Healthcare Providers: https://www.woods-mathews.com/ This test is not yet approved or cleared by the [place] FDA and  has been authorized for detection and/or diagnosis of SARS-CoV-2 by FDA under an Emergency Use Authorization (EUA). This EUA will remain  in effect (meaning this test can be used) for the duration of the COVID-19 declaration under Section 56 4(b)(1) of the Act, 21 U.S.C. section 360bbb-3(b)(1), unless the authorization is terminated or revoked sooner. Performed at [facility], [place] [address] [place], [place] [address]   Culture, Urine     Status: None   Collection Time: 09/03/19  9:26 AM   Specimen: Urine, Catheterized  Result Value Ref Range Status   Specimen Description URINE, CATHETERIZED  Final   Special Requests NONE  Final   Culture   Final    NO GROWTH Performed at [facility], [address], [place], [place] [address]    Report Status 09/04/2019 FINAL  Final         Radiology Studies: CT CHEST WO CONTRAST  Result Date: 09/02/2019 CLINICAL DATA:  86 year old male with chest pain and increased lower extremity edema. EXAM: CT CHEST WITHOUT CONTRAST TECHNIQUE: Multidetector CT imaging of the chest was performed following the standard protocol without IV contrast. COMPARISON:  Chest radiograph dated 09/01/2019. FINDINGS: Evaluation of this exam is limited in the absence of intravenous contrast. Cardiovascular: Top-normal cardiac size. No pericardial effusion. Advanced 3 vessel coronary vascular calcification. There is moderate atherosclerotic calcification of the thoracic aorta. The central pulmonary arteries are grossly unremarkable on this noncontrast CT. Mediastinum/Nodes: No hilar or mediastinal adenopathy. Evaluation however is limited in the absence of intravenous contrast and secondary to consolidative changes of the right lung. The esophagus is grossly unremarkable. No mediastinal fluid collection. Lungs/Pleura: There is a moderate right and small left pleural effusions. There is near complete consolidative changes of the right lower lobe which may represent atelectasis or infiltrate. Underlying mass is not excluded. Clinical correlation and follow-up to resolution recommended. Left lung base and lingular densities noted which are concerning for infiltrate. There is no pneumothorax. The central airways are patent. Upper Abdomen: Mild diffuse upper abdominal stranding and edema. Irregular liver contour may represent changes of cirrhosis. Cholecystectomy. Musculoskeletal: Osteopenia with degenerative changes of the spine and enthesopathy likely representing ankylosing spondylitis. Lower cervical ACDF. No acute osseous pathology. IMPRESSION: 1. Moderate right and small left pleural effusions. 2. Near complete consolidative changes of the right lower lobe as well as areas of airspace  opacity in the left lower lobe and lingula may represent atelectasis or pneumonia. Clinical correlation and follow-up recommended. 3. Severe 3 vessel coronary vascular calcification and Aortic Atherosclerosis (ICD10-I70.0). Electronically Signed   By: Anner Crete M.D.   On: 09/02/2019 22:14   US RENAL  Result Date: 09/03/2019 CLINICAL DATA:  Acute renal failure, history of chronic kidney disease, hypertension, pulmonary embolism, former smoker EXAM: RENAL / URINARY TRACT ULTRASOUND COMPLETE COMPARISON:  11/19/2017 FINDINGS: Right Kidney: Renal measurements: 9.1 x 5.2 x 6.7 cm = volume: 167 mL. Marked cortical thinning. Increased cortical echogenicity. Small cyst at upper pole 19 x 22 x 19  mm. No additional mass or hydronephrosis. Left Kidney: Renal measurements: 9.7 x 5.8 x 5.7 cm = volume: 165 mL. Marked cortical thinning. Increased cortical echogenicity. 2 simple cysts are identified, at upper pole 25 x 21 x 15 mm and at mid kidney 10 x 12 x 14 mm. No additional masses or hydronephrosis. Bladder: Appears normal for degree of bladder distention. Other: N/A IMPRESSION: Marked cortical atrophy and medical renal disease changes of both kidneys. BILATERAL renal cysts. No evidence of solid renal mass or hydronephrosis. Electronically Signed   By: Lavonia Dana M.D.   On: 09/03/2019 12:02   DG Chest Portable 1 View  Result Date: 09/02/2019 CLINICAL DATA:  Shortness of breath EXAM: PORTABLE CHEST 1 VIEW COMPARISON:  October 22, 2018 FINDINGS: There is a pleural effusion on each side, larger on the right than the left. There is consolidation throughout much of the right middle and lower lobes. There is slight atelectasis in the left base. Heart is mildly enlarged with pulmonary vascularity normal. No adenopathy. There is postoperative change in the lower cervical spine. There is a radiopaque appearing structure in the right base region medially of uncertain etiology. IMPRESSION: Consolidation involving much of the  right middle and lower lobes. Bilateral pleural effusions, larger on the right than the left. Heart mildly enlarged, stable. No adenopathy. Radiopaque structure in the medial right base of uncertain etiology. This structure measures 4.4 x 2.6 cm. Its etiology is uncertain. Correlation with lateral chest radiograph potentially could be helpful for further assessment. Electronically Signed   By: Lowella Grip III M.D.   On: 09/02/2019 14:31   ECHOCARDIOGRAM COMPLETE  Result Date: 09/03/2019   ECHOCARDIOGRAM REPORT **  Patient Name:   PRANISH AKHAVAN Date of Exam: 09/03/2019 Medical Rec #:  [id number]      Height:       72.0 in Accession #:    [id number]     Weight:       234.7 lb Date of Birth:  [birth year]      BSA:          2.28 m Patient Age:    71 years       BP:           123/71 mmHg Patient Gender: M              HR:           63 bpm. Exam Location:  Inpatient Procedure: 2D Echo, Cardiac Doppler and Color Doppler Indications:    R94.31 Abnormal EKG  History:        Patient has prior history of Echocardiogram examinations, most                 recent 12/24/2018. Risk Factors:Hypertension, Diabetes and Sleep                 Apnea. Pulmonary Embolism. CKD.  Sonographer:    Jonelle Sidle Dance Referring Phys: [id number] BELKYS A REGALADO IMPRESSIONS  1. Left ventricular ejection fraction, by visual estimation, is 65 to 70%. The left ventricle has hyperdynamic function. There is moderately increased left ventricular hypertrophy.  2. Left ventricular diastolic parameters are consistent with Grade II diastolic dysfunction (pseudonormalization).  3. Global right ventricle has normal systolic function.The right ventricular size is normal. No increase in right ventricular wall thickness.  4. Left atrial size was normal.  5. Right atrial size was normal.  6. Mild mitral annular calcification.  7. The mitral valve is normal in structure. Mild mitral  valve regurgitation. No evidence of mitral stenosis.  8. The tricuspid valve is  normal in structure.  9. The tricuspid valve is normal in structure. Tricuspid valve regurgitation is moderate. 10. Aortic valve mean gradient measures 19.7 mmHg. 11. Aortic valve peak gradient measures 34.2 mmHg. 12. The aortic valve is normal in structure. Aortic valve regurgitation is mild. Moderate aortic valve stenosis. 13. There is severe calcifcation of the aortic valve. 14. There is severe thickening of the aortic valve. 15. The pulmonic valve was normal in structure. Pulmonic valve regurgitation is not visualized. 16. Normal pulmonary artery systolic pressure. 17. The inferior vena cava is normal in size with greater than 50% respiratory variability, suggesting right atrial pressure of 3 mmHg. FINDINGS  Left Ventricle: Left ventricular ejection fraction, by visual estimation, is 65 to 70%. The left ventricle has hyperdynamic function. The left ventricle is not well visualized. There is moderately increased left ventricular hypertrophy. Left ventricular  diastolic parameters are consistent with Grade II diastolic dysfunction (pseudonormalization). Normal left atrial pressure. Right Ventricle: The right ventricular size is normal. No increase in right ventricular wall thickness. Global RV systolic function is has normal systolic function. The tricuspid regurgitant velocity is 2.32 m/s, and with an assumed right atrial pressure  of 3 mmHg, the estimated right ventricular systolic pressure is normal at 24.5 mmHg. Left Atrium: Left atrial size was normal in size. Right Atrium: Right atrial size was normal in size Pericardium: There is no evidence of pericardial effusion. Mitral Valve: The mitral valve is normal in structure. Mild mitral annular calcification. Mild mitral valve regurgitation. No evidence of mitral valve stenosis by observation. Tricuspid Valve: The tricuspid valve is normal in structure. Tricuspid valve regurgitation is moderate. Aortic Valve: The aortic valve is normal in structure.. There is  severe thickening and severe calcifcation of the aortic valve. Aortic valve regurgitation is mild. Moderate aortic stenosis is present. There is severe thickening of the aortic valve. There  is severe calcifcation of the aortic valve. Aortic valve mean gradient measures 19.7 mmHg. Aortic valve peak gradient measures 34.2 mmHg. Aortic valve area, by VTI measures 1.32 cm. Pulmonic Valve: The pulmonic valve was normal in structure. Pulmonic valve regurgitation is not visualized. Pulmonic regurgitation is not visualized. Aorta: The aortic root, ascending aorta and aortic arch are all structurally normal, with no evidence of dilitation or obstruction. Venous: The inferior vena cava is normal in size with greater than 50% respiratory variability, suggesting right atrial pressure of 3 mmHg. IAS/Shunts: No atrial level shunt detected by color flow Doppler. There is no evidence of a patent foramen ovale. No ventricular septal defect is seen or detected. There is no evidence of an atrial septal defect.  LEFT VENTRICLE PLAX 2D LVIDd:         4.47 cm  Diastology LVIDs:         3.61 cm  LV e' lateral:   6.31 cm/s LV PW:         1.41 cm  LV E/e' lateral: 18.1 LV IVS:        1.76 cm  LV e' medial:    6.96 cm/s LVOT diam:     2.20 cm  LV E/e' medial:  16.4 LV SV:         36 ml LV SV Index:   15.38 LVOT Area:     3.80 cm  RIGHT VENTRICLE             IVC RV Basal diam:  2.89 cm  IVC diam: 1.74 cm RV S prime:     13.50 cm/s TAPSE (M-mode): 2.1 cm LEFT ATRIUM             Index       RIGHT ATRIUM           Index LA diam:        4.50 cm 1.97 cm/m  RA Area:     20.30 cm LA Vol (A2C):   94.0 ml 41.22 ml/m RA Volume:   62.00 ml  27.19 ml/m LA Vol (A4C):   39.7 ml 17.41 ml/m LA Biplane Vol: 62.4 ml 27.36 ml/m  AORTIC VALVE AV Area (Vmax):    1.23 cm AV Area (Vmean):   1.30 cm AV Area (VTI):     1.32 cm AV Vmax:           292.25 cm/s AV Vmean:          204.000 cm/s AV VTI:            0.775 m AV Peak Grad:      34.2 mmHg AV Mean  Grad:      19.7 mmHg LVOT Vmax:         94.20 cm/s LVOT Vmean:        69.500 cm/s LVOT VTI:          0.268 m LVOT/AV VTI ratio: 0.35  AORTA Ao Root diam: 3.60 cm Ao Asc diam:  3.80 cm MITRAL VALVE                         TRICUSPID VALVE MV Area (PHT): 3.17 cm              TR Peak grad:   21.5 mmHg MV PHT:        69.31 msec            TR Vmax:        251.00 cm/s MV Decel Time: 239 msec MV E velocity: 114.00 cm/s 103 cm/s  SHUNTS MV A velocity: 68.40 cm/s  70.3 cm/s Systemic VTI:  0.27 m MV E/A ratio:  1.67        1.5       Systemic Diam: 2.20 cm  Candee Furbish MD Electronically signed by Candee Furbish MD Signature Date/Time: 09/03/2019/1:17:30 PM    Final    DG HIP UNILAT WITH PELVIS 1V RIGHT  Result Date: 09/02/2019 CLINICAL DATA:  Pain and swelling EXAM: DG HIP (WITH OR WITHOUT PELVIS) 1V RIGHT COMPARISON:  10/13/2009, CT 02/12/2012 FINDINGS: SI joints are non widened. Extensive vascular calcifications. Pubic symphysis and rami are intact. Moderate severe arthritis left hip. Prior right hip replacement with intact hardware and normal alignment. IMPRESSION: Prior right hip replacement.  No acute osseous abnormality. Electronically Signed   By: Donavan Foil M.D.   On: 09/02/2019 20:19        Scheduled Meds:  calcitRIOL  0.25 mcg Oral QODAY   insulin aspart  0-6 Units Subcutaneous TID WC   insulin glargine  20 Units Subcutaneous Daily   sodium bicarbonate  1,300 mg Oral TID   tamsulosin  0.4 mg Oral QPC supper   tiZANidine  4 mg Oral QHS   Warfarin - Pharmacist Dosing Inpatient   Does not apply q1800   Continuous Infusions:  sodium chloride 250 mL (09/03/19 1056)   azithromycin 500 mg (09/04/19 0831)   cefTRIAXone (ROCEPHIN)  IV 2 g (09/03/19 1058)   furosemide 120 mg (09/04/19  3437)     LOS: 2 days    Time spent: 40 minutes    Irine Seal, MD [facility]  If 7PM-7AM, please contact night-coverage [email or web address] 09/04/2019, 10:27 AM

## 2019-09-04 NOTE — Progress Notes (Addendum)
Pt HR in sinus brady with occasional drops into the 30s (lowest HR 29).  Notified by CCMD that patient showed 2nd Deg. Heart Block Type 1. MD paged. Pt asymptomatic and stable. Will continue monitoring.

## 2019-09-05 LAB — GLUCOSE, CAPILLARY
Glucose-Capillary: 65 mg/dL — ABNORMAL LOW (ref 70–99)
Glucose-Capillary: 107 mg/dL — ABNORMAL HIGH (ref 70–99)
Glucose-Capillary: 104 mg/dL — ABNORMAL HIGH (ref 70–99)
Glucose-Capillary: 70 mg/dL (ref 70–99)
Glucose-Capillary: 114 mg/dL — ABNORMAL HIGH (ref 70–99)
Glucose-Capillary: 179 mg/dL — ABNORMAL HIGH (ref 70–99)

## 2019-09-05 LAB — RENAL FUNCTION PANEL
Albumin: 2.9 g/dL — ABNORMAL LOW (ref 3.5–5.0)
Anion gap: 15 (ref 5–15)
BUN: 109 mg/dL — ABNORMAL HIGH (ref 8–23)
CO2: 19 mmol/L — ABNORMAL LOW (ref 22–32)
Calcium: 7.7 mg/dL — ABNORMAL LOW (ref 8.9–10.3)
Chloride: 113 mmol/L — ABNORMAL HIGH (ref 98–111)
Creatinine, Ser: 5.6 mg/dL — ABNORMAL HIGH (ref 0.61–1.24)
GFR calc Af Amer: 10 mL/min — ABNORMAL LOW (ref >60)
GFR calc non Af Amer: 9 mL/min — ABNORMAL LOW (ref >60)
Glucose, Bld: 73 mg/dL (ref 70–99)
Phosphorus: 7.3 mg/dL — ABNORMAL HIGH (ref 2.5–4.6)
Potassium: 3.7 mmol/L (ref 3.5–5.1)
Sodium: 147 mmol/L — ABNORMAL HIGH (ref 135–145)

## 2019-09-05 LAB — LEGIONELLA PNEUMOPHILA SEROGP 1 UR AG: L. pneumophila Serogp 1 Ur Ag: NEGATIVE

## 2019-09-05 LAB — PROTIME-INR
INR: 2.5 — ABNORMAL HIGH (ref 0.8–1.2)
Prothrombin Time: 26.9 seconds — ABNORMAL HIGH (ref 11.4–15.2)

## 2019-09-05 MED ORDER — INSULIN GLARGINE 100 UNIT/ML ~~LOC~~ SOLN
10.0000 [IU] | Freq: Every day | SUBCUTANEOUS | Status: DC
  Administered 2019-09-06 – 2019-09-08 (×3): 10 [IU] via SUBCUTANEOUS
  Filled 2019-09-05 (×3): qty 0.1

## 2019-09-05 MED ORDER — DICLOFENAC SODIUM 1 % EX GEL
2.0000 g | Freq: Four times a day (QID) | CUTANEOUS | Status: DC | PRN
  Administered 2019-09-05 – 2019-09-08 (×4): 2 g via TOPICAL
  Filled 2019-09-05: qty 100

## 2019-09-05 MED ORDER — WARFARIN SODIUM 2.5 MG PO TABS
2.5000 mg | ORAL_TABLET | Freq: Once | ORAL | Status: AC
  Administered 2019-09-05: 2.5 mg via ORAL
  Filled 2019-09-05: qty 1

## 2019-09-05 NOTE — Progress Notes (Addendum)
Occupational Therapy Treatment Patient Details Name: Chase [name] MRN: [id number] DOB: Nov 15, 1932 Today's Date: 09/05/2019    History of present illness 86 y.o. male past medical history significant for chronic kidney disease stage IV,  prior cr 2.0 2013, diabetes, peripheral vascular disease, history of PE, who presents complaining of worsening shortness of breath.   OT comments  OT called back to room by staff to assist pt as recliner back had malfunctioned. Nursing notified and able to come to pt's room. Assisted pt with transfer and bed mobility to return to bed. Pt left in bed, resting comfortably, call button in reach, and needs met. D/c plan remains appropriate.   Follow Up Recommendations  Home health OT;Supervision/Assistance - 24 hour;Other (comment)(HH aide)    Equipment Recommendations  None recommended by OT    Recommendations for Other Services      Precautions / Restrictions Precautions Precautions: Fall Restrictions Weight Bearing Restrictions: No Other Position/Activity Restrictions: BLE painful to the touch, "burning" pain       Mobility Bed Mobility Overal bed mobility: Needs Assistance Bed Mobility: Sit to Supine     Supine to sit: Min assist;HOB elevated Sit to supine: Mod assist   General bed mobility comments: mod A to advance BLE onto bed. +2 to scoot up in bed  Transfers Overall transfer level: Needs assistance Equipment used: Rolling walker (2 wheeled) Transfers: Sit to/from [facility] Sit to Stand: Mod assist;+2 physical assistance;From elevated surface Stand pivot transfers: Mod assist;+2 safety/equipment       General transfer comment: steadying assist and assist to powerup and control descent.     Balance Overall balance assessment: Needs assistance Sitting-balance support: Bilateral upper extremity supported;Feet supported Sitting balance-Leahy Scale: Fair Sitting balance - Comments: mod I   Standing balance  support: Bilateral upper extremity supported;During functional activity Standing balance-Leahy Scale: Poor Standing balance comment: rw and steadying assist in static standing                           ADL either performed or assessed with clinical judgement   ADL   General ADL Comments: Pt completed pivot transfer and bed mobility     Vision       Perception     Praxis      Cognition Arousal/Alertness: Awake/alert Behavior During Therapy: WFL for tasks assessed/performed Overall Cognitive Status: Within Functional Limits for tasks assessed                                 General Comments: recliner had malfunctioned pt in distress and with increased pain due to urgency to reposition for safety.        Exercises     Shoulder Instructions       General Comments      Pertinent Vitals/ Pain       Pain Assessment: Faces Faces Pain Scale: Hurts worst Pain Location: BLE Pain Descriptors / Indicators: Burning;Constant;Sore Pain Intervention(s): Monitored during session;Repositioned;Limited activity within patient's tolerance  Home Living Family/patient expects to be discharged to:: Private residence Living Arrangements: Spouse/significant other Available Help at Discharge: Available 24 hours/day;Family(son and daughter live nearby) Type of Home: House Home Access: Stairs to enter Technical brewer of Steps: 3 Entrance Stairs-Rails: None Home Layout: Two level;Bed/bath upstairs;Able to live on main level with bedroom/bathroom Alternate Level Stairs-Number of Steps: 11 Alternate Level Stairs-Rails: Left Bathroom Shower/Tub: Walk-in shower  Bathroom Toilet: Standard Bathroom Accessibility: Yes   Home Equipment: Walker - 2 wheels;Cane - quad;Cane - single point;Bedside commode;Walker - 4 wheels;Shower seat - built in          Prior Functioning/Environment Level of Independence: Independent with assistive device(s);Needs assistance   Gait / Transfers Assistance Needed: pt reports using rollator most frequently, most mobility in the house ADL's / Homemaking Assistance Needed: pt reports increasing difficulty with use of hands, needs assist due to numbness in BUE   Comments: son and daughter do all cooking and driving for the pt and his wife   Frequency  Min 2X/week        Progress Toward Goals  OT Goals(current goals can now be found in the care plan section)  Progress towards OT goals: Progressing toward goals  Acute Rehab OT Goals Patient Stated Goal: reduce pain, move better OT Goal Formulation: With patient Time For Goal Achievement: 09/19/19 Potential to Achieve Goals: Good ADL Goals Pt Will Perform Lower Body Bathing: with modified independence;with supervision;sit to/from stand Pt Will Perform Lower Body Dressing: with modified independence;with supervision;sit to/from stand Pt Will Transfer to Toilet: with supervision;ambulating Pt Will Perform Toileting - Clothing Manipulation and hygiene: with modified independence;sit to/from stand Pt Will Perform Tub/Shower Transfer: with min guard assist;with supervision;ambulating;rolling walker;shower seat  Plan Discharge plan remains appropriate    Co-evaluation    PT/OT/SLP Co-Evaluation/Treatment: Yes Reason for Co-Treatment: Complexity of the patient's impairments (multi-system involvement);For patient/therapist safety;To address functional/ADL transfers PT goals addressed during session: Mobility/safety with mobility;Balance;Proper use of DME OT goals addressed during session: ADL's and self-care      AM-PAC OT "6 Clicks" Daily Activity     Outcome Measure   Help from another person eating meals?: None Help from another person taking care of personal grooming?: None Help from another person toileting, which includes using toliet, bedpan, or urinal?: A Lot Help from another person bathing (including washing, rinsing, drying)?: A Lot Help from  another person to put on and taking off regular upper body clothing?: A Little Help from another person to put on and taking off regular lower body clothing?: A Lot 6 Click Score: 17    End of Session Equipment Utilized During Treatment: Rolling walker;Oxygen  OT Visit Diagnosis: Unsteadiness on feet (R26.81);Pain;Muscle weakness (generalized) (M62.81);Other abnormalities of gait and mobility (R26.89)   Activity Tolerance Patient limited by pain   Patient Left with call bell/phone within reach;in bed;with bed alarm set   Nurse Communication Other (comment)(Nurse entered room and updated on chair malfunction)        Time: 1125-1140 OT Time Calculation (min): 15 min  Charges: OT General Charges $OT Visit: 1 Visit OT Evaluation $OT Eval Moderate Complexity: 1 Mod OT Treatments $Self Care/Home Management : 8-22 mins  Tyrone Schimke, OT Acute Rehabilitation Services Pager: [phone] Office: [phone]    Hortencia Pilar 09/05/2019, 12:23 PM

## 2019-09-05 NOTE — Evaluation (Signed)
Physical Therapy Evaluation Patient Details Name: Chase [name] MRN: [id number] DOB: June 08, 1933 Today's Date: 09/05/2019   History of Present Illness  86 y.o. male past medical history significant for chronic kidney disease stage IV,  prior cr 2.0 2013, diabetes, peripheral vascular disease, history of PE, who presents complaining of worsening shortness of breath.  Clinical Impression  Pt in bed upon arrival of PT/OT, agreeable to evaluations this morning. The pt presents with limitations in functional mobility, strength, and stability compared to his prior level of function and independence due to above dx as well as chronic LE pain due to PVD. The pt was able to demo bed mobility and transfers, but requires mod assist of 1-2 to complete mobility safely. The pt was able to tolerate decrease in supplemental O2 from 3L to 0.5L while maintaining SpO2 > 94% with ambulation. The pt will continue to benefit from skilled PT to maximize strength for functional mobility and transfers to decrease caregiver burden and improve functional independence. Pt is adamant that his son and daughter can provide physical assist needed for d/c home, recommend HHPT to maximize function and independence as well as continued home palliative care to meet pt's pain goals.      Follow Up Recommendations Home health PT;Supervision/Assistance - 24 hour;Other (comment)(continue home palliative)    Equipment Recommendations  Wheelchair (measurements PT);Wheelchair cushion (measurements PT)(based on mobility today, a WC may be beneficial for longer distances based on family wants)    Recommendations for Other Services       Precautions / Restrictions Precautions Precautions: Fall Restrictions Weight Bearing Restrictions: No Other Position/Activity Restrictions: BLE painful to the touch, "burning" pain      Mobility  Bed Mobility Overal bed mobility: Needs Assistance Bed Mobility: Supine to Sit     Supine to sit:  Min assist;HOB elevated     General bed mobility comments: Pt able to advance BLE off bed with no physical assist. Pt asking for therapist arm to pull up on to facilitate trunk power up. Extra time and effort to pivot hips to full EOB position.   Transfers Overall transfer level: Needs assistance Equipment used: Rolling walker (2 wheeled) Transfers: Sit to/from [facility] Sit to Stand: Mod assist;+2 physical assistance;From elevated surface Stand pivot transfers: Mod assist;+2 safety/equipment       General transfer comment: +2 assist to power up and steady from EOB, Pt reporting increased pain in BLE instanding position. After <30 seconds pt initiating pivotal steps to recliner utilizing rw. Assist to steady, +2 for safety/equipment. Assist to control descent into recliner.   Ambulation/Gait Ambulation/Gait assistance: Mod assist;+2 safety/equipment Gait Distance (Feet): 3 Feet Assistive device: Rolling walker (2 wheeled) Gait Pattern/deviations: Step-to pattern;Shuffle;Narrow base of support   Gait velocity interpretation: <1.31 ft/sec, indicative of household ambulator General Gait Details: Pt able to take multiple small lateral steps to R to transition to recliner. The pt relies heavily on RW but needs only physical assist of 1, assist of +2 for safety/line managemet  Stairs            Wheelchair Mobility    Modified Rankin (Stroke Patients Only)       Balance Overall balance assessment: Needs assistance Sitting-balance support: Bilateral upper extremity supported;Feet supported Sitting balance-Leahy Scale: Fair Sitting balance - Comments: mod I   Standing balance support: Bilateral upper extremity supported;During functional activity Standing balance-Leahy Scale: Poor Standing balance comment: rw and steadying assist in static standing  Pertinent Vitals/Pain Pain Assessment: Faces Faces Pain Scale: Hurts  even more Pain Location: BLE Pain Descriptors / Indicators: Burning;Constant;Sore Pain Intervention(s): Limited activity within patient's tolerance;Monitored during session;Repositioned    Home Living Family/patient expects to be discharged to:: Private residence Living Arrangements: Spouse/significant other Available Help at Discharge: Available 24 hours/day;Family(son and daughter live nearby) Type of Home: House Home Access: Stairs to enter Entrance Stairs-Rails: None Entrance Stairs-Number of Steps: 3 Home Layout: Two level;Bed/bath upstairs;Able to live on main level with bedroom/bathroom Home Equipment: Gilford Rile - 2 wheels;Cane - quad;Cane - single point;Bedside commode;Walker - 4 wheels;Shower seat - built in      Prior Function Level of Independence: Independent with assistive device(s);Needs assistance   Gait / Transfers Assistance Needed: pt reports using rollator most frequently, most mobility in the house  ADL's / Homemaking Assistance Needed: pt reports increasing difficulty with use of hands, needs assist due to numbness in BUE  Comments: son and daughter do all cooking and driving for the pt and his wife     Hand Dominance   Dominant Hand: Right    Extremity/Trunk Assessment   Upper Extremity Assessment Upper Extremity Assessment: Defer to OT evaluation("my fingers go numb sometimes" pt reports difficulty w/FMC) RUE Deficits / Details: limited R shoulder flexion/ABDuction at baseline RUE: Unable to fully assess due to pain    Lower Extremity Assessment Lower Extremity Assessment: Generalized weakness;RLE deficits/detail;LLE deficits/detail RLE Deficits / Details: sig pain, especially with any contact decreased ability to bear wt RLE: Unable to fully assess due to pain LLE Deficits / Details: sig pain, especially with any contact decreased ability to bear wt LLE: Unable to fully assess due to pain    Cervical / Trunk Assessment Cervical / Trunk Assessment:  Kyphotic  Communication   Communication: No difficulties;HOH(pt has glassees and states that he "needs hearing aides")  Cognition Arousal/Alertness: Awake/alert Behavior During Therapy: WFL for tasks assessed/performed Overall Cognitive Status: Within Functional Limits for tasks assessed                                        General Comments      Exercises     Assessment/Plan    PT Assessment Patient needs continued PT services  PT Problem List Decreased strength;Decreased mobility;Decreased range of motion;Decreased coordination;Decreased activity tolerance;Decreased skin integrity;Cardiopulmonary status limiting activity;Decreased balance;Pain       PT Treatment Interventions DME instruction;Therapeutic exercise;Gait training;Balance training;Stair training;Functional mobility training;Therapeutic activities;Patient/family education;Other (comment)    PT Goals (Current goals can be found in the Care Plan section)  Acute Rehab PT Goals Patient Stated Goal: reduce pain, move better PT Goal Formulation: With patient Time For Goal Achievement: 09/19/19 Potential to Achieve Goals: Fair    Frequency Min 3X/week   Barriers to discharge   pt reports son and daughter help him and his wife, pt required modA of 2 today to stand due to pain in BLE, he may benefit from additional pain management through palliative or personal care assistants to take burden off of children    Co-evaluation PT/OT/SLP Co-Evaluation/Treatment: Yes Reason for Co-Treatment: Complexity of the patient's impairments (multi-system involvement);For patient/therapist safety;To address functional/ADL transfers PT goals addressed during session: Mobility/safety with mobility;Balance;Proper use of DME OT goals addressed during session: ADL's and self-care       AM-PAC PT "6 Clicks" Mobility  Outcome Measure Help needed turning from your back to your side while  in a flat bed without using  bedrails?: A Little Help needed moving from lying on your back to sitting on the side of a flat bed without using bedrails?: A Little Help needed moving to and from a bed to a chair (including a wheelchair)?: A Little Help needed standing up from a chair using your arms (e.g., wheelchair or bedside chair)?: A Lot Help needed to walk in hospital room?: A Lot Help needed climbing 3-5 steps with a railing? : A Lot 6 Click Score: 15    End of Session Equipment Utilized During Treatment: Gait belt;Oxygen(0.5 L) Activity Tolerance: Patient tolerated treatment well;Patient limited by pain Patient left: in chair;with chair alarm set;with nursing/sitter in room;with call bell/phone within reach Nurse Communication: Mobility status PT Visit Diagnosis: Difficulty in walking, not elsewhere classified (R26.2);Unsteadiness on feet (R26.81);Pain Pain - Right/Left: (bilateral) Pain - part of body: Leg    Time: 4967-5916 PT Time Calculation (min) (ACUTE ONLY): 38 min   Charges:   PT Evaluation $PT Eval Moderate Complexity: 1 Mod PT Treatments $Gait Training: 8-22 mins        Karma Ganja, PT, DPT   Acute Rehabilitation Department Pager #: [phone]  Otho Bellows 09/05/2019, 11:17 AM

## 2019-09-05 NOTE — Progress Notes (Signed)
[place] for warfarin Indication: history of VTE   Patient Measurements: Height: 6' (182.9 cm) Weight: 227 lb 9.6 oz (103.2 kg) IBW/kg (Calculated) : 77.6   Vital Signs: Temp: 98.3 F (36.8 C) (01/22 0403) Temp Source: Oral (01/22 0403) BP: 133/67 (01/22 0403) Pulse Rate: 85 (01/22 0403)  Labs: Recent Labs    09/02/19 1351 09/02/19 1412 09/02/19 1733 09/03/19 0404 09/04/19 0411 09/05/19 0540  HGB 8.8*   < >  --  8.5* 8.3*  --   HCT 27.7*  --   --  26.4* 27.4*  --   PLT 128*  --   --  113* 114*  --   APTT  --   --   --  67*  --   --   LABPROT  --    < >  --  57.6* 53.6* 26.9*  INR  --    < >  --  6.6* 6.0* 2.5*  CREATININE 6.24*   < >  --  6.16* 5.84* 5.60*  TROPONINIHS 94*  --  97*  --   --   --    < > = values in this interval not displayed.    Assessment: 86 yo male admitted with SOB. Pt is on warfarin for hx of VTE.  His INR is supratherapeutic at 6.6 with no associated bleeding. Vitamin K sq 2 mg on 1/20 and 1/21. HF and antibiotics likely contributing to elevated INR  INR now 2.5 s/p vitamin K doses x 2, will start with lower dose today.  CBC low but stable, plts 114.    PTA warfarin dose is 5 mg po daily.   Goal of Therapy:  INR 2-3 Monitor platelets by anticoagulation protocol: Yes   Plan:  Give warfarin 2.5mg  PO x 1 today Daily INR, s/s bleeding  Bertis Ruddy, PharmD Clinical Pharmacist Please check AMION for all [place] numbers 09/05/2019 10:01 AM

## 2019-09-05 NOTE — Progress Notes (Addendum)
PROGRESS NOTE    Chase [name]  IWP:[id number] DOB: 1933-06-12 DOA: 09/02/2019 PCP: Mayra Neer, MD    Brief Narrative:  HPI per Dr. Rene Paci Chase [name] is a 86 y.o. male past medical history significant for chronic kidney disease stage IV,  prior cr 2.0 2013, diabetes, peripheral vascular disease, history of PE, who presents complaining of worsening shortness of breath, on and off for the last several hour month.  Patient reports shortness of breath even walking from his house to the garage, he gave out his leg he gets worsening shortness of breath and he has to stop.  He also report episode of chest pains on and off last a few minutes sharp.  Last episode couple of weeks ago.  He denies currently chest pain. He denies fever or cough.  He does report some abdominal pain and distention.  Evaluation in the ED: Sodium 142, potassium 4.3, chloride 114, CO2 13, glucose 204, BUN 115, creatinine 6.2, calcium 7.7, albumin 3.3, AST 7 ALT 11, total protein 6.4, BNP 441, troponin 94, hemoglobin 8.8, white blood cell 3.8, platelets 128, INR 6.3, SARS coronavirus 2 -.  Chest x-ray: Consolidation involving much of the right middle and lower lobes.  Bilateral pleural effusions, larger on the right than the left. Heart mildly enlarged, stable. No adenopathy.Radiopaque structure in the medial right base of uncertain etiology. This structure measures 4.4 x 2.6 cm. Its etiology is uncertain. Correlation with lateral chest radiograph potentially could be helpful for further assessment.   Assessment & Plan:   Principal Problem:   Acute on chronic respiratory failure with hypoxia (HCC) Active Problems:   Acute on chronic diastolic CHF (congestive heart failure) (HCC)   Acute renal failure superimposed on stage 4 chronic kidney disease ([place])   Long term (current) use of anticoagulants   History of pulmonary embolism   Essential hypertension   Hyperlipidemia   Chronic renal insufficiency, stage 4  (severe) (HCC)   Moderate aortic stenosis   Bilateral lower extremity edema   Renal failure   AKI (acute kidney injury) ([place])   Metabolic acidosis   CAP (community acquired pneumonia): Probable   Elevated troponin   Supratherapeutic INR  1 acute on chronic respiratory failure with hypoxia secondary to acute on chronic diastolic CHF exacerbation Patient presented to the ED with worsening shortness of breath with lower extremity edema bilaterally.  Patient with volume overload on examination and per x-ray findings with bilateral pleural effusions.  Concerned that patient may be having progressive worsening renal function leading to volume overload.  Patient was placed on Lasix 60 mg IV every 12 with poor urine output and Lasix increased to 120 mg IV every 12 hours per nephrology recommendations.  Patient with urine output that is picking up with a urine output of 2.8 L over the past 24 hours.  Patient slowly clinically improving.  Creatinine starting to trend back down. Patient has been seen by nephrology and patient noted to have no interest in pursuing short or long-term dialysis.  Continue current dose of IV Lasix and if no significant improvement may need to transition to hospice.  Palliative care consultation pending.  Nephrology following and appreciate input and recommendations.  2.  Acute on chronic kidney disease stage IV versus progressive chronic kidney disease stage V Patient noted to have a baseline creatinine of approximately 4.  On admission creatinine was up to 6.  Creatinine slowly trending down and currently at 5.6.  Patient with evidence of volume overload  with lower extremity edema, shortness of breath, bilateral pleural effusion on chest x-ray, acidosis noted on labs.  Patient states his outpatient nephrologist had talked to him about hemodialysis however seems to be less inclined to go on dialysis.  Patient initially was on Lasix 60 mg IV every 12 hours with no significant urine  output and Lasix increased to 120 mg IV every 8 hours with urine output that was recorded of 2.8 L over the past 24 hours.  Patient is -2.6 L during this hospitalization.  Urine output picking up.  Current weight of 103.2 kg from 105.6 kg from 106.5 kg from 107.7 kg from 99.8 kg.  Patient still acidotic and on bicarb tablets.  Patient seen in consultation by nephrology patient has no interest in pursuing short-term dialysis and as such we will continue with high-dose IV Lasix and antibiotic treatment.  Per nephrology if patient fails to improve will likely need to transition to hospice.  Palliative care has been consulted and palliative care consultation pending.  Nephrology following and appreciate input and recommendations.  3.  Community-acquired pneumonia Noted on chest x-ray and CT chest.  Patient currently afebrile.  Patient had presented with worsening shortness of breath.  Urine strep pneumococcus antigen negative.  Urine Legionella antigen negative.  Sputum Gram stain and cultures pending.  Continue empiric IV Rocephin and azithromycin and likely transition to oral antibiotics tomorrow.  Continue supportive care.   4.  Positive troponin/elevated troponins Patient with some complaints of chest pain that he describes as a sharp pain that has been intermittent over the past several weeks.  Patient denies any ongoing chest pain.  Cardiac enzymes with high-sensitivity troponins of 94>>:> 97.  Troponin seem to be plateauing and likely demand ischemia secondary to volume overload.  Patient denies any ongoing acute chest pain.  2D echo with a EF of 65 to 70%, moderately increased LVH, grade 2 diastolic dysfunction, no wall motion abnormalities, severe thickening of aortic valve and severe calcification of aortic valve.  CT chest which was done with severe three-vessel coronary vascular calcification and aortic atherosclerosis.  Patient denies any ongoing acute chest pain.  Curb sided cardiology, likely needs  outpatient follow-up.  Follow.  5.  Iron deficiency anemia/anemia of chronic disease H&H stable at 8.3.  Status post aranesp and  IV Feraheme per nephrology.  Follow.  6.  Supratherapeutic INR INR at 2.5 today from 6.0 from 6.6 from 6.3 on admission.  Status post vitamin K.  Patient with no overt bleeding.  Monitor INR drift downwards.  Coumadin being held.  Coumadin per pharmacy.  7.  History of PE on chronic Coumadin INR supratherapeutic at admission.  Patient received some vitamin K.  INR down to 2.5.  Coumadin on hold.  Coumadin per pharmacy.  8.  Right hip pain Plain films negative for any fracture.  9.  Diabetes mellitus type 2 Hemoglobin A1c of 7.0.  CBG of 65 this morning.  Decrease Lantus to 10 units daily.  Sliding scale insulin.    DVT prophylaxis: Therapeutic INR.   Code Status: Full Family Communication: Updated patient.  No family at bedside. Disposition Plan: To be determined.   Consultants:   Nephrology: Dr. Joelyn Oms 09/03/2019  Palliative care pending  Procedures:   Renal ultrasound 09/03/2019  2D echo 09/03/2019  Chest x-ray 09/02/2019  Plain films of the right hip and pelvis 09/02/2019  Antimicrobials:   IV Rocephin 09/03/2019  IV azithromycin 09/03/2019   Subjective: Patient stuck in recliner chair laying flat  and asking for help to get up to the bed.  States shortness of breath slowly improving.  Denies any chest pain.  Patient denies any bleeding.  Objective: Vitals:   09/04/19 1617 09/04/19 2043 09/05/19 0403 09/05/19 1106  BP: 138/70 136/64 133/67   Pulse: 85 66 85   Resp: 20 20 20    Temp: 97.6 F (36.4 C) 97.9 F (36.6 C) 98.3 F (36.8 C)   TempSrc: Oral Oral Oral   SpO2: 93% 92% 98% 96%  Weight:   103.2 kg   Height:        Intake/Output Summary (Last 24 hours) at 09/05/2019 1119 Last data filed at 09/05/2019 0917 [place] per 24 hour  Intake 1794.49 ml  Output 3800 ml  Net -2005.51 ml   Filed Weights   09/03/19 0341 09/04/19 0458  09/05/19 0403  Weight: 106.5 kg 105.6 kg 103.2 kg    Examination:  General exam: Appears calm and comfortable. Respiratory system: Decreasing bibasilar crackles.  Decreased breath sounds in the bases.  No wheezing.  Cardiovascular system: RRR with 3/6 SEM. 1-2 + pedal edema.  Gastrointestinal system: Abdomen is, soft, nontender, nondistended, positive bowel sounds.  No rebound.  No guarding. Central nervous system: Alert and oriented. No focal neurological deficits. Extremities: 1-2+ pedal edema.  Skin: Bilateral lower extremities with chronic venous stasis changes noted.   Psychiatry: Judgement and insight appear normal. Mood & affect appropriate.     Data Reviewed: I have personally reviewed following labs and imaging studies  CBC: Recent Labs  Lab 09/02/19 1351 09/03/19 0404 09/04/19 0411  WBC 3.8* 3.4* 4.0  NEUTROABS 2.5  --  2.5  HGB 8.8* 8.5* 8.3*  HCT 27.7* 26.4* 27.4*  MCV 89.1 87.7 89.5  PLT 128* 113* 735*   Basic Metabolic Panel: Recent Labs  Lab 09/02/19 1351 09/03/19 0404 09/04/19 0411 09/05/19 0540  NA 142 144 148* 147*  K 4.3 4.4 4.3 3.7  CL 114* 113* 116* 113*  CO2 13* 15* 15* 19*  GLUCOSE 204* 112* 119* 73  BUN 115* 116* 114* 109*  CREATININE 6.24* 6.16* 5.84* 5.60*  CALCIUM 7.7* 7.8* 7.8* 7.7*  PHOS  --   --  9.8* 7.3*   GFR: Estimated Creatinine Clearance: 12.2 mL/min (A) (by C-G formula based on SCr of 5.6 mg/dL (H)). Liver Function Tests: Recent Labs  Lab 09/02/19 1351 09/04/19 0411 09/05/19 0540  AST 7*  --   --   ALT 11  --   --   ALKPHOS 65  --   --   BILITOT 0.7  --   --   PROT 6.4*  --   --   ALBUMIN 3.3* 3.2* 2.9*   No results for input(s): LIPASE, AMYLASE in the last 168 hours. No results for input(s): AMMONIA in the last 168 hours. Coagulation Profile: Recent Labs  Lab 09/02/19 1412 09/03/19 0404 09/04/19 0411 09/05/19 0540  INR 6.3* 6.6* 6.0* 2.5*   Cardiac Enzymes: No results for input(s): CKTOTAL, CKMB,  CKMBINDEX, TROPONINI in the last 168 hours. BNP (last 3 results) No results for input(s): PROBNP in the last 8760 hours. HbA1C: Recent Labs    09/03/19 0404  HGBA1C 7.0*   CBG: Recent Labs  Lab 09/04/19 1126 09/04/19 1614 09/04/19 2141 09/05/19 0611 09/05/19 0733  GLUCAP 124* 114* 143* 65* 107*   Lipid Profile: No results for input(s): CHOL, HDL, LDLCALC, TRIG, CHOLHDL, LDLDIRECT in the last 72 hours. Thyroid Function Tests: No results for input(s): TSH, T4TOTAL, FREET4, T3FREE, THYROIDAB  in the last 72 hours. Anemia Panel: Recent Labs    09/03/19 0404  VITAMINB12 321  FOLATE 11.9  FERRITIN 53  TIBC 293  IRON 29*  RETICCTPCT 1.9   Sepsis Labs: No results for input(s): PROCALCITON, LATICACIDVEN in the last 168 hours.  Recent Results (from the past 240 hour(s))  SARS CORONAVIRUS 2 (TAT 6-24 HRS) Nasopharyngeal Nasopharyngeal Swab     Status: None   Collection Time: 09/02/19  4:34 PM   Specimen: Nasopharyngeal Swab  Result Value Ref Range Status   SARS Coronavirus 2 NEGATIVE NEGATIVE Final    Comment: (NOTE) SARS-CoV-2 target nucleic acids are NOT DETECTED. The SARS-CoV-2 RNA is generally detectable in upper and lower respiratory specimens during the acute phase of infection. Negative results do not preclude SARS-CoV-2 infection, do not rule out co-infections with other pathogens, and should not be used as the sole basis for treatment or other patient management decisions. Negative results must be combined with clinical observations, patient history, and epidemiological information. The expected result is Negative. Fact Sheet for Patients: SugarRoll.be Fact Sheet for Healthcare Providers: https://www.woods-mathews.com/ This test is not yet approved or cleared by the [place] FDA and  has been authorized for detection and/or diagnosis of SARS-CoV-2 by FDA under an Emergency Use Authorization (EUA). This EUA will  remain  in effect (meaning this test can be used) for the duration of the COVID-19 declaration under Section 56 4(b)(1) of the Act, 21 U.S.C. section 360bbb-3(b)(1), unless the authorization is terminated or revoked sooner. Performed at [facility], [place] [address], [place], [place] [address]   Culture, Urine     Status: None   Collection Time: 09/03/19  9:26 AM   Specimen: Urine, Catheterized  Result Value Ref Range Status   Specimen Description URINE, CATHETERIZED  Final   Special Requests NONE  Final   Culture   Final    NO GROWTH Performed at [facility], [address], [place], [place] [address]    Report Status 09/04/2019 FINAL  Final         Radiology Studies: US RENAL  Result Date: 09/03/2019 CLINICAL DATA:  Acute renal failure, history of chronic kidney disease, hypertension, pulmonary embolism, former smoker EXAM: RENAL / URINARY TRACT ULTRASOUND COMPLETE COMPARISON:  11/19/2017 FINDINGS: Right Kidney: Renal measurements: 9.1 x 5.2 x 6.7 cm = volume: 167 mL. Marked cortical thinning. Increased cortical echogenicity. Small cyst at upper pole 19 x 22 x 19 mm. No additional mass or hydronephrosis. Left Kidney: Renal measurements: 9.7 x 5.8 x 5.7 cm = volume: 165 mL. Marked cortical thinning. Increased cortical echogenicity. 2 simple cysts are identified, at upper pole 25 x 21 x 15 mm and at mid kidney 10 x 12 x 14 mm. No additional masses or hydronephrosis. Bladder: Appears normal for degree of bladder distention. Other: N/A IMPRESSION: Marked cortical atrophy and medical renal disease changes of both kidneys. BILATERAL renal cysts. No evidence of solid renal mass or hydronephrosis. Electronically Signed   By: Lavonia Dana M.D.   On: 09/03/2019 12:02        Scheduled Meds: . calcitRIOL  0.25 mcg Oral QODAY  . insulin aspart  0-6 Units Subcutaneous TID WC  . insulin glargine  20 Units Subcutaneous Daily  . sodium bicarbonate  1,300 mg Oral TID  . tamsulosin   0.4 mg Oral QPC supper  . tiZANidine  4 mg Oral QHS  . warfarin  2.5 mg Oral ONCE-1800  . Warfarin - Pharmacist  Dosing Inpatient   Does not apply q1800   Continuous Infusions: . sodium chloride 250 mL (09/03/19 1056)  . azithromycin 500 mg (09/05/19 1014)  . cefTRIAXone (ROCEPHIN)  IV 2 g (09/05/19 0909)  . furosemide 120 mg (09/05/19 0706)     LOS: 3 days    Time spent: 40 minutes    Irine Seal, MD [facility]  If 7PM-7AM, please contact night-coverage [email or web address] 09/05/2019, 11:19 AM

## 2019-09-05 NOTE — Evaluation (Signed)
Occupational Therapy Evaluation Patient Details Name: Chase [name] MRN: [id number] DOB: 03-13-1933 Today's Date: 09/05/2019    History of Present Illness 86 y.o. male past medical history significant for chronic kidney disease stage IV,  prior cr 2.0 2013, diabetes, peripheral vascular disease, history of PE, who presents complaining of worsening shortness of breath.   Clinical Impression   Pt admitted with the above diagnoses and presents with below problem list. Pt will benefit from continued acute OT to address the below listed deficits and maximize independence with basic ADLs prior to d/c home with family. PTA pt reports he was using walker for mobility, limited distances around the house. Pt lives with his spouse who also has health issues per his report. He also reports 2 adult children that assist with transportation and IADLs (household tasks). Pt limited by BLE pain this session which he reports as "burning" sensation. He is currenlty +2 mod A with functional transfers and LB ADLs. Pt reports his children can provided any kind of help he may need at home. Discussed having one of his children with him 24/7 at time of d/c. Pt would also benefit from any [facility] aide hours he may qualify for to help him manage at home safely.     Follow Up Recommendations  Home health OT;Supervision/Assistance - 24 hour;Other (comment)(HH aide)    Equipment Recommendations  None recommended by OT    Recommendations for Other Services       Precautions / Restrictions Precautions Precautions: Fall Restrictions Weight Bearing Restrictions: No Other Position/Activity Restrictions: BLE painful to the touch, "burning" pain      Mobility Bed Mobility Overal bed mobility: Needs Assistance Bed Mobility: Supine to Sit     Supine to sit: Min assist;HOB elevated     General bed mobility comments: Pt able to advance BLE off bed with no physical assist. Pt asking for therapist arm to pull up on to  facilitate trunk power up. Extra time and effort to pivot hips to full EOB position.   Transfers Overall transfer level: Needs assistance Equipment used: Rolling walker (2 wheeled) Transfers: Sit to/from [facility] Sit to Stand: Mod assist;+2 physical assistance;From elevated surface Stand pivot transfers: Mod assist;+2 safety/equipment       General transfer comment: +2 assist to power up and steady from EOB, Pt reporting increased pain in BLE instanding position. After <30 seconds pt initiating pivotal steps to recliner utilizing rw. Assist to steady, +2 for safety/equipment. Assist to control descent into recliner.     Balance Overall balance assessment: Needs assistance Sitting-balance support: Bilateral upper extremity supported;Feet supported Sitting balance-Leahy Scale: Fair     Standing balance support: Bilateral upper extremity supported;During functional activity Standing balance-Leahy Scale: Poor Standing balance comment: rw and steadying assist in static standing                           ADL either performed or assessed with clinical judgement   ADL Overall ADL's : Needs assistance/impaired Eating/Feeding: Set up;Sitting   Grooming: Set up;Sitting   Upper Body Bathing: Set up;Minimal assistance;Sitting   Lower Body Bathing: Moderate assistance;+2 for physical assistance;Sit to/from stand   Upper Body Dressing : Set up;Sitting;Minimal assistance   Lower Body Dressing: Moderate assistance;+2 for physical assistance;Sit to/from stand   Toilet Transfer: Moderate assistance;+2 for safety/equipment;Stand-pivot;RW Toilet Transfer Details (indicate cue type and reason): simulated with EOB to recliner. increased BLE pain limiting activity tolerance and balance Toileting- Clothing  Manipulation and Hygiene: Moderate assistance;+2 for physical assistance;Sit to/from stand   Tub/ Banker: Walk-in shower;Moderate assistance;+2 for physical  assistance;Stand-pivot;Shower seat;Rolling walker     General ADL Comments: Pt completed bed mobility and stand-pivot to recliner. BLE pain liminting session.      Vision         Perception     Praxis      Pertinent Vitals/Pain Pain Assessment: Faces Faces Pain Scale: Hurts even more Pain Location: BLE Pain Descriptors / Indicators: Burning;Constant;Sore Pain Intervention(s): Limited activity within patient's tolerance;Monitored during session;Repositioned     Hand Dominance Right   Extremity/Trunk Assessment Upper Extremity Assessment Upper Extremity Assessment: Defer to OT evaluation("my fingers go numb sometimes" pt reports difficulty w/FMC) RUE Deficits / Details: limited R shoulder flexion/ABDuction at baseline RUE: Unable to fully assess due to pain   Lower Extremity Assessment Lower Extremity Assessment: Generalized weakness;RLE deficits/detail;LLE deficits/detail RLE Deficits / Details: sig pain, especially with any contact decreased ability to bear wt RLE: Unable to fully assess due to pain LLE Deficits / Details: sig pain, especially with any contact decreased ability to bear wt LLE: Unable to fully assess due to pain   Cervical / Trunk Assessment Cervical / Trunk Assessment: Kyphotic   Communication Communication Communication: No difficulties;HOH(pt has glassees and states that he "needs hearing aides")   Cognition Arousal/Alertness: Awake/alert Behavior During Therapy: WFL for tasks assessed/performed Overall Cognitive Status: Within Functional Limits for tasks assessed                                     General Comments       Exercises     Shoulder Instructions      Home Living Family/patient expects to be discharged to:: Private residence Living Arrangements: Spouse/significant other Available Help at Discharge: Available 24 hours/day;Family(son and daughter live nearby) Type of Home: House Home Access: Stairs to  enter Technical brewer of Steps: 3 Entrance Stairs-Rails: None Home Layout: Two level;Bed/bath upstairs;Able to live on main level with bedroom/bathroom Alternate Level Stairs-Number of Steps: 11 Alternate Level Stairs-Rails: Left Bathroom Shower/Tub: Occupational psychologist: Standard Bathroom Accessibility: Yes   Home Equipment: Environmental consultant - 2 wheels;Cane - quad;Cane - single point;Bedside commode;Walker - 4 wheels;Shower seat - built in          Prior Functioning/Environment Level of Independence: Independent with assistive device(s);Needs assistance  Gait / Transfers Assistance Needed: pt reports using rollator most frequently, most mobility in the house ADL's / Homemaking Assistance Needed: pt reports increasing difficulty with use of hands, needs assist due to numbness in BUE   Comments: son and daughter do all cooking and driving for the pt and his wife        OT Problem List: Decreased activity tolerance;Decreased strength;Impaired balance (sitting and/or standing);Decreased knowledge of use of DME or AE;Decreased knowledge of precautions;Cardiopulmonary status limiting activity;Pain      OT Treatment/Interventions: Self-care/ADL training;Energy conservation;DME and/or AE instruction;Therapeutic activities;Patient/family education;Balance training    OT Goals(Current goals can be found in the care plan section) Acute Rehab OT Goals Patient Stated Goal: reduce pain, move better OT Goal Formulation: With patient Time For Goal Achievement: 09/19/19 Potential to Achieve Goals: Good ADL Goals Pt Will Perform Lower Body Bathing: with modified independence;with supervision;sit to/from stand Pt Will Perform Lower Body Dressing: with modified independence;with supervision;sit to/from stand Pt Will Transfer to Toilet: with supervision;ambulating Pt Will Perform Toileting -  Clothing Manipulation and hygiene: with modified independence;sit to/from stand Pt Will Perform  Tub/Shower Transfer: with min guard assist;with supervision;ambulating;rolling walker;shower seat  OT Frequency: Min 2X/week   Barriers to D/C:            Co-evaluation PT/OT/SLP Co-Evaluation/Treatment: Yes Reason for Co-Treatment: Complexity of the patient's impairments (multi-system involvement);For patient/therapist safety;To address functional/ADL transfers PT goals addressed during session: Mobility/safety with mobility;Balance;Proper use of DME OT goals addressed during session: ADL's and self-care      AM-PAC OT "6 Clicks" Daily Activity     Outcome Measure Help from another person eating meals?: None Help from another person taking care of personal grooming?: None Help from another person toileting, which includes using toliet, bedpan, or urinal?: A Lot Help from another person bathing (including washing, rinsing, drying)?: A Lot Help from another person to put on and taking off regular upper body clothing?: A Little Help from another person to put on and taking off regular lower body clothing?: A Lot 6 Click Score: 17   End of Session Equipment Utilized During Treatment: Gait belt;Rolling walker;Oxygen Nurse Communication: Mobility status  Activity Tolerance: Patient limited by pain Patient left: in chair;with call bell/phone within reach;with chair alarm set  OT Visit Diagnosis: Unsteadiness on feet (R26.81);Pain;Muscle weakness (generalized) (M62.81);Other abnormalities of gait and mobility (R26.89)                Time: 5916-3846 OT Time Calculation (min): 42 min Charges:  OT General Charges $OT Visit: 1 Visit OT Evaluation $OT Eval Moderate Complexity: [place], OT Acute Rehabilitation Services Pager: [phone] Office: [phone]   Hortencia Pilar 09/05/2019, 11:10 AM

## 2019-09-05 NOTE — Progress Notes (Signed)
Admit: 09/02/2019 LOS: 3  8M with progressive CKD5 presenting with dCHF / Hypervolemia / pleural effusions, ? CAP, worsened renal failure.  Subjective:  . Had difficulty with chair this AM . Daughter at bedside, updated  . Making more urine, SCr downtrending  01/21 0701 - 01/22 0700 In: 2255.4 [P.O.:1200; IV Piggyback:1055.4] Out: 2800 [Urine:2800]  Filed Weights   09/03/19 0341 09/04/19 0458 09/05/19 0403  Weight: 106.5 kg 105.6 kg 103.2 kg    Scheduled Meds: . calcitRIOL  0.25 mcg Oral QODAY  . insulin aspart  0-6 Units Subcutaneous TID WC  . insulin glargine  20 Units Subcutaneous Daily  . sodium bicarbonate  1,300 mg Oral TID  . tamsulosin  0.4 mg Oral QPC supper  . tiZANidine  4 mg Oral QHS  . warfarin  2.5 mg Oral ONCE-1800  . Warfarin - Pharmacist Dosing Inpatient   Does not apply q1800   Continuous Infusions: . sodium chloride 250 mL (09/03/19 1056)  . azithromycin 500 mg (09/05/19 1014)  . cefTRIAXone (ROCEPHIN)  IV 2 g (09/05/19 0909)  . furosemide 120 mg (09/05/19 0706)   PRN Meds:.sodium chloride, acetaminophen **OR** acetaminophen, HYDROcodone-acetaminophen, nitroGLYCERIN, ondansetron **OR** ondansetron (ZOFRAN) IV  Current Labs: reviewed    Physical Exam:  Blood pressure (!) 152/57, pulse 95, temperature 97.7 F (36.5 C), temperature source Oral, resp. rate 19, height 6' (1.829 m), weight 103.2 kg, SpO2 98 %. GEN: NAD, elderly male, conversant, nl wob ENT: NCAt EYES: EOMI CV: regular, nl s1s2 no rub PULM: bibasilar rhonchi, clear in upper air fields ABD: s/nt/nd SKIN:  Chronic venous stasis changes / hyperpigmentation in LEs EXT:2-3+ LEE  A 1. Progressive CKD5, follows at [place] with [facility] 2. dCHF exacerbation / hypervolemia / pleural effusion / pulmonary edema 3. ? CAP on CTX/Azithromycin, could be all #2 4. AFib on warfarin 5. Hx/o PE 6. Chronic venous statsis 7. DM2 8. Anemia with features of IDA and due to #1, status post ESA and IV iron  1/20 9. Metabolic Acidosis on NaHCo3, improving 10. Supratherapeutic INR per TRH, improving  P 1. Slowly improving, would cont to diurese again for 24h and then reassess for transition to oral regimen 2. Cont aggressive PT/OT 3. They want to have palliative services upon discharge, very reasonable 4. If decompensates would not do HD and would pursue hospice 5. We will closely follow along. 6. Daily weights, Daily Renal Panel, Strict I/Os, Avoid nephrotoxins (NSAIDs, judicious IV Contrast)     Pearson Grippe MD 09/05/2019, 1:11 PM  Recent Labs  Lab 09/03/19 0404 09/04/19 0411 09/05/19 0540  NA 144 148* 147*  K 4.4 4.3 3.7  CL 113* 116* 113*  CO2 15* 15* 19*  GLUCOSE 112* 119* 73  BUN 116* 114* 109*  CREATININE 6.16* 5.84* 5.60*  CALCIUM 7.8* 7.8* 7.7*  PHOS  --  9.8* 7.3*   Recent Labs  Lab 09/02/19 1351 09/03/19 0404 09/04/19 0411  WBC 3.8* 3.4* 4.0  NEUTROABS 2.5  --  2.5  HGB 8.8* 8.5* 8.3*  HCT 27.7* 26.4* 27.4*  MCV 89.1 87.7 89.5  PLT 128* 113* 114*

## 2019-09-05 NOTE — Progress Notes (Signed)
Inpatient Diabetes Program Recommendations  AACE/ADA: New Consensus Statement on Inpatient Glycemic Control (2015)  Target Ranges:  Prepandial:   less than 140 mg/dL      Peak postprandial:   less than 180 mg/dL (1-2 hours)      Critically ill patients:  140 - 180 mg/dL   Lab Results  Component Value Date   GLUCAP 104 (H) 09/05/2019   HGBA1C 7.0 (H) 09/03/2019    Review of Glycemic Control Results for PEARL, BERLINGER (MRN [id number]) as of 09/05/2019 12:55  Ref. Range 09/05/2019 06:11 09/05/2019 07:33 09/05/2019 12:21  Glucose-Capillary Latest Ref Range: 70 - 99 mg/dL 65 (L) 107 (H) 104 (H)   Diabetes history: Type 2 DM Outpatient Diabetes medications: Amaryl 2 mg PRN, Lantus 30-40 units QAM Current orders for Inpatient glycemic control: Lantus 20 units QD, Novolog 0-6 units TID  Inpatient Diabetes Program Recommendations:    Noted mild hypoglycemia this of AM of 65 mg/dL. Given renal status, consider reducing Lantus to 12 units QD.   Thanks, Bronson Curb, MSN, RNC-OB Diabetes Coordinator [phone] (8a-5p)

## 2019-09-06 DIAGNOSIS — R52 Pain, unspecified: Secondary | ICD-10-CM

## 2019-09-06 DIAGNOSIS — Z66 Do not resuscitate: Secondary | ICD-10-CM

## 2019-09-06 DIAGNOSIS — Z515 Encounter for palliative care: Secondary | ICD-10-CM

## 2019-09-06 DIAGNOSIS — Z7189 Other specified counseling: Secondary | ICD-10-CM

## 2019-09-06 DIAGNOSIS — R0689 Other abnormalities of breathing: Secondary | ICD-10-CM

## 2019-09-06 DIAGNOSIS — R0600 Dyspnea, unspecified: Secondary | ICD-10-CM

## 2019-09-06 DIAGNOSIS — M6281 Muscle weakness (generalized): Secondary | ICD-10-CM

## 2019-09-06 LAB — RENAL FUNCTION PANEL
Albumin: 3 g/dL — ABNORMAL LOW (ref 3.5–5.0)
Anion gap: 15 (ref 5–15)
BUN: 99 mg/dL — ABNORMAL HIGH (ref 8–23)
CO2: 21 mmol/L — ABNORMAL LOW (ref 22–32)
Calcium: 8.3 mg/dL — ABNORMAL LOW (ref 8.9–10.3)
Chloride: 112 mmol/L — ABNORMAL HIGH (ref 98–111)
Creatinine, Ser: 5.41 mg/dL — ABNORMAL HIGH (ref 0.61–1.24)
GFR calc Af Amer: 10 mL/min — ABNORMAL LOW (ref >60)
GFR calc non Af Amer: 9 mL/min — ABNORMAL LOW (ref >60)
Glucose, Bld: 87 mg/dL (ref 70–99)
Phosphorus: 5.9 mg/dL — ABNORMAL HIGH (ref 2.5–4.6)
Potassium: 3.4 mmol/L — ABNORMAL LOW (ref 3.5–5.1)
Sodium: 148 mmol/L — ABNORMAL HIGH (ref 135–145)

## 2019-09-06 LAB — GLUCOSE, CAPILLARY
Glucose-Capillary: 83 mg/dL (ref 70–99)
Glucose-Capillary: 167 mg/dL — ABNORMAL HIGH (ref 70–99)
Glucose-Capillary: 202 mg/dL — ABNORMAL HIGH (ref 70–99)
Glucose-Capillary: 208 mg/dL — ABNORMAL HIGH (ref 70–99)

## 2019-09-06 LAB — PROTIME-INR
INR: 1.8 — ABNORMAL HIGH (ref 0.8–1.2)
Prothrombin Time: 21 seconds — ABNORMAL HIGH (ref 11.4–15.2)

## 2019-09-06 LAB — CBC
HCT: 27.4 % — ABNORMAL LOW (ref 39.0–52.0)
Hemoglobin: 8.6 g/dL — ABNORMAL LOW (ref 13.0–17.0)
MCH: 27.6 pg (ref 26.0–34.0)
MCHC: 31.4 g/dL (ref 30.0–36.0)
MCV: 87.8 fL (ref 80.0–100.0)
Platelets: 125 10*3/uL — ABNORMAL LOW (ref 150–400)
RBC: 3.12 MIL/uL — ABNORMAL LOW (ref 4.22–5.81)
RDW: 17.3 % — ABNORMAL HIGH (ref 11.5–15.5)
WBC: 6.9 10*3/uL (ref 4.0–10.5)
nRBC: 0.9 % — ABNORMAL HIGH (ref 0.0–0.2)

## 2019-09-06 MED ORDER — SODIUM BICARBONATE 650 MG PO TABS
1300.0000 mg | ORAL_TABLET | Freq: Two times a day (BID) | ORAL | Status: DC
  Administered 2019-09-06 – 2019-09-07 (×3): 1300 mg via ORAL
  Filled 2019-09-06 (×3): qty 2

## 2019-09-06 MED ORDER — POTASSIUM CHLORIDE 20 MEQ PO PACK
20.0000 meq | PACK | Freq: Once | ORAL | Status: AC
  Administered 2019-09-06: 20 meq via ORAL
  Filled 2019-09-06: qty 1

## 2019-09-06 MED ORDER — OXYCODONE HCL 5 MG PO TABS: 2.5000 mg | ORAL_TABLET | ORAL | Status: DC | PRN

## 2019-09-06 MED ORDER — WARFARIN SODIUM 7.5 MG PO TABS
7.5000 mg | ORAL_TABLET | Freq: Once | ORAL | Status: AC
  Administered 2019-09-06: 7.5 mg via ORAL
  Filled 2019-09-06: qty 1

## 2019-09-06 MED ORDER — ACETAMINOPHEN 325 MG PO TABS
650.0000 mg | ORAL_TABLET | Freq: Three times a day (TID) | ORAL | Status: DC
  Administered 2019-09-06 – 2019-09-08 (×7): 650 mg via ORAL
  Filled 2019-09-06 (×7): qty 2

## 2019-09-06 MED ORDER — HEPARIN SODIUM (PORCINE) 5000 UNIT/ML IJ SOLN
5000.0000 [IU] | Freq: Three times a day (TID) | INTRAMUSCULAR | Status: DC
  Administered 2019-09-06 – 2019-09-07 (×3): 5000 [IU] via SUBCUTANEOUS
  Filled 2019-09-06 (×3): qty 1

## 2019-09-06 MED ORDER — CEFDINIR 300 MG PO CAPS
300.0000 mg | ORAL_CAPSULE | ORAL | Status: DC
  Administered 2019-09-06 – 2019-09-08 (×3): 300 mg via ORAL
  Filled 2019-09-06 (×3): qty 1

## 2019-09-06 NOTE — Consult Note (Addendum)
Consultation Note Date: 09/06/2019   Patient Name: Chase [name]  DOB: 1932-08-27  MRN: [id number]  Age / Sex: 86 y.o., male  PCP: Mayra Neer, MD Referring Physician: Eugenie Filler, MD  Reason for Consultation: Establishing goals of care  HPI/Patient Profile:  Per intake H&P --> Chase [name] is a 86 y.o. male past medical history significant for chronic kidney disease stage IV,  prior cr 2.0 2013, diabetes, peripheral vascular disease, history of PE, who presented complaining of worsening shortness of breath. Has been on IV abx to treat CAP. Receiving diuresis to aid in volume overload.   Patient has significant CKD5, appears to have made some gradual improvements with diuresis though his underlying disease it quite pronounced.   Palliative Care was asked to consult to further address goals of care.  Clinical Assessment and Goals of Care: I have reviewed medical records including EPIC notes, labs and imaging.   I met with Chase [name] at bedside to further discuss diagnosis prognosis, GOC, EOL wishes, disposition and options.  I received report from bedside RN, assessed the patient. He is in good spirits. He reports that he is still "not breathing well."   I introduced Palliative Medicine as specialized medical care for people living with serious illness. It focuses on providing relief from the symptoms and stress of a serious illness. The goal is to improve quality of life for both the patient and the family.  Chase [name] was able to share that he is a retired Acupuncturist, he was a member of the unit for 38 years. He has a wife who he has been married to since 1958 with whom he has two children, a son Ronalee Belts and a daughter, Lattie Haw. He states that he has had a wonderfully life with his family. He has loved teaching and learning from them over the years. He has a deep faith in the [place] and  believes that he will be taken care of.  We talked about his recent hospitalization and how that has made him feel. He said that he is aware that kid kidneys are not functioning well. He has been aware of this for many years now. He shared that his wife is on hemodialysis and he see's what a toll that has taken on her. He does not wish to have hemodialysis ever as he believes that at his age he would not tolerate it well. He feels that he would have no quality of life if her were to pursue a life of dialysis.   We further discussed his code status. We went through what a code in his situation may look like and the trauma that his body may have to undergo secondary to such efforts. He stated that he would not want CPR or intubation. These things would not in his opinion increase the quality of the life he is living. He said that at his age there is not more to do. He wants to enjoy whatever time he has left with his children and wife.  I introduced the topic of hospice as this might be an appropriate alternative for him to live out his days in his home enabling treatment of troubling symptoms such as his generalized pain. He said that he would like to hear what hospice may have to say.  Chase [name] asked that I speak with his wife this morning about our conversation. Shortly after leaving his room I reached out to his wife, Silva Bandy who was accompanied by his daughter, Lattie Haw. We all talked about Chase [name] present state. The both agreed with no HD, no CPR, no intubation. They also agreed that speaking to hospice was something they would like to pursue as above all else they would like him to be comfortable at home.   Discussed the importance of continued conversation with family and their medical providers regarding overall plan of care and treatment options, ensuring decisions are within the context of the patients values and GOCs.  All questions answered.  Dr.Thompson informed of our conversations  thereafter.  Palliative care will continue to follow and help coordinate plans moving forward  Decision Maker: Clarke Peretz (Spouse) [phone]  SUMMARY OF RECOMMENDATIONS   DNR/DNI No Dialysis TOC --> Hospice consult Chaplain to offer support  Code Status/Advance Care Planning:  DNR   No hemodialysis  Symptom Management:  Goals of Care:                 - As above    Generalized Pain:  - Tylenol 670m Po TID  - Oxycodone 2.5-59mPO Q4H PRN  - Agree with voltaren gel  - Would use zanaflex with caution as it can increase likelihood of delirium in our geriatric population  Dyspnea:  - Supplemental O2  - Diuresis per primary  - If required could consider low dose IV opoid, would likely opt to utilize fentanyl IV  Given kidney dysfuntion   Muscular Weakness:                 - OOB to chair as often as able  Delirium:                 - Delirium precautions                 - Get up during the day                 - Encourage a familiar face to remain present throughout the day                 - Keep blinds open and lights on during daylight hours                 - Minimize the use of opioids/benzodiazepines   Spiritual Support:                 - Chaplain consult   Palliative Prophylaxis:   Aspiration, Bowel Regimen, Delirium Protocol, Eye Care, Frequent Pain Assessment, Oral Care and Turn Reposition  Additional Recommendations (Limitations, Scope, Preferences):  Minimize Medications  Psycho-social/Spiritual:   Desire for further Chaplaincy support:yes  Additional Recommendations: Caregiving  Support/Resources and Education on Hospice  Prognosis:   < 6 months  Discharge Planning: To Be Determined, family is presently getting information on Hospice. They will decide if they are interested in enrolling or not thereafter.      Primary Diagnoses: Present on Admission: . Renal failure . History of pulmonary embolism . Essential hypertension .  Hyperlipidemia . Chronic renal insufficiency, stage 4 (  severe) ([place]) . Bilateral lower extremity edema . Metabolic acidosis . CAP (community acquired pneumonia): Probable . Acute on chronic respiratory failure with hypoxia ([place]) . Acute on chronic diastolic CHF (congestive heart failure) ([place]) . Acute renal failure superimposed on stage 4 chronic kidney disease ([place]) . Elevated troponin  I have reviewed the medical record, interviewed the patient and family, and examined the patient. The following aspects are pertinent.  Past Medical History:  Diagnosis Date  . Arthritis   . Chronic kidney disease    DECREASED KIDNEY FUNCT, DR [place]  . Complication of anesthesia    BOWELS "QUIT" DECREASED KIDNEY FXN S\P HIP REPLACEMENT AT [facility] 2011   . Diabetes mellitus   . Dizziness - light-headed   . Hypertension   . Neuromuscular disorder (HCC)    NUMB/TINGLING ARMS, LEGS, FEET   . Peripheral vascular disease ([place])   . Pulmonary embolus ([place])    1970S + 1999  . Sleep apnea    USES CPAP  AT NIGHT  . Swelling of both ankles    DISCOLORATION  (PURPLE / RED)     Social History   Socioeconomic History  . Marital status: Married    Spouse name: Not on file  . Number of children: Not on file  . Years of education: Not on file  . Highest education level: Not on file  Occupational History  . Not on file  Tobacco Use  . Smoking status: Former Research scientist (life sciences)  . Smokeless tobacco: Never Used  Substance and Sexual Activity  . Alcohol use: No  . Drug use: No  . Sexual activity: Not on file  Other Topics Concern  . Not on file  Social History Narrative  . Not on file   Social Determinants of Health   Financial Resource Strain:   . Difficulty of Paying Living Expenses: Not on file  Food Insecurity:   . Worried About Charity fundraiser in the Last Year: Not on file  . Ran Out of Food in the Last Year: Not on file  Transportation Needs:   . Lack of Transportation (Medical): Not on file   . Lack of Transportation (Non-Medical): Not on file  Physical Activity:   . Days of Exercise per Week: Not on file  . Minutes of Exercise per Session: Not on file  Stress:   . Feeling of Stress : Not on file  Social Connections:   . Frequency of Communication with Friends and Family: Not on file  . Frequency of Social Gatherings with Friends and Family: Not on file  . Attends Religious Services: Not on file  . Active Member of Clubs or Organizations: Not on file  . Attends Archivist Meetings: Not on file  . Marital Status: Not on file   Family History  Problem Relation Age of Onset  . Heart attack Mother   . Heart attack Father   . Colon cancer Father    Scheduled Meds: . calcitRIOL  0.25 mcg Oral QODAY  . insulin aspart  0-6 Units Subcutaneous TID WC  . insulin glargine  10 Units Subcutaneous Daily  . sodium bicarbonate  1,300 mg Oral TID  . tamsulosin  0.4 mg Oral QPC supper  . tiZANidine  4 mg Oral QHS  . Warfarin - Pharmacist Dosing Inpatient   Does not apply q1800   Continuous Infusions: . sodium chloride 250 mL (09/03/19 1056)  . azithromycin 500 mg (09/05/19 1014)  . cefTRIAXone (ROCEPHIN)  IV 2 g (09/05/19 0909)  .  furosemide 120 mg (09/06/19 0459)   PRN Meds:.sodium chloride, acetaminophen **OR** acetaminophen, diclofenac Sodium, HYDROcodone-acetaminophen, nitroGLYCERIN, ondansetron **OR** ondansetron (ZOFRAN) IV Medications Prior to Admission:  Prior to Admission medications   Medication Sig Start Date End Date Taking? Authorizing Provider  amLODipine (NORVASC) 10 MG tablet Take 10 mg by mouth at bedtime.    Yes [provider]  amoxicillin (AMOXIL) 500 MG capsule Take 2,000 mg by mouth See admin instructions. Take 2,000 mg by mouth one hour prior to dental appointments   Yes [provider]  diphenhydrAMINE (BENADRYL) 25 MG tablet Take 25 mg by mouth at bedtime.   Yes [provider]  furosemide (LASIX) 40 MG tablet Take  60-80 mg by mouth every morning.    Yes [provider]  glimepiride (AMARYL) 4 MG tablet Take 2 mg by mouth daily as needed (if insulin is not taken).    Yes [provider]  HYDROcodone-acetaminophen (NORCO) 7.5-325 MG tablet Take 1 tablet by mouth 3 (three) times daily as needed (for pain).    Yes [provider]  indomethacin (INDOCIN) 25 MG capsule Take 25 mg by mouth daily as needed for mild pain.   Yes [provider]  Insulin Glargine (LANTUS SOLOSTAR) 100 UNIT/ML Solostar Pen Inject 30-40 Units into the skin daily before breakfast.    Yes [provider]  metoprolol tartrate (LOPRESSOR) 50 MG tablet Take 50 mg by mouth 2 (two) times daily.   Yes [provider]  Tamsulosin HCl (FLOMAX) 0.4 MG CAPS Take 0.4 mg by mouth daily.    Yes [provider]  tiZANidine (ZANAFLEX) 4 MG tablet Take 4 mg by mouth at bedtime.   Yes [provider]  warfarin (COUMADIN) 5 MG tablet TAKE 1 & 1/2 (ONE & ONE-HALF) TABLETS BY MOUTH ONCE DAILY OR  AS  DIRECTED Patient taking differently: Take 5 mg by mouth at bedtime.  07/01/19  Yes Lorretta Harp, MD  allopurinol (ZYLOPRIM) 100 MG tablet Take 100 mg by mouth daily. 05/14/19   [provider]  calcitRIOL (ROCALTROL) 0.25 MCG capsule Take 0.25 mcg by mouth every other day. 07/14/19   [provider]  finasteride (PROSCAR) 5 MG tablet Take 5 mg by mouth daily. 12/09/13   [provider]   Allergies  Allergen Reactions  . Sulfa Antibiotics Other (See Comments)    From childhood- reaction??   Review of Systems  Constitutional: Positive for activity change and appetite change.  HENT: Negative.   Eyes: Negative.   Respiratory: Negative.   Cardiovascular: Negative.   Gastrointestinal: Negative.   Endocrine: Negative.   Musculoskeletal: Positive for arthralgias.   Physical Exam Vitals and nursing note reviewed.  HENT:     Head: Normocephalic.      Mouth/Throat:     Pharynx: Oropharynx is clear.  Eyes:     Pupils: Pupils are equal, round, and reactive to light.  Cardiovascular:     Rate and Rhythm: Normal rate and regular rhythm.  Pulmonary:     Effort: Pulmonary effort is normal.     Comments: Crackles bilaterally Abdominal:     Palpations: Abdomen is soft.  Musculoskeletal:     Cervical back: Normal range of motion.     Comments: Weak  Skin:    General: Skin is warm and dry.     Capillary Refill: Capillary refill takes less than 2 seconds.  Neurological:     Mental Status: He is alert and oriented to person, place, and time.  Psychiatric:        Mood and Affect: Mood normal.    Vital Signs: BP 135/64 (BP Location: Left Arm)   Pulse 92   Temp 99.2 F (37.3 C) (Oral)   Resp 19   Ht 6' (1.829 m)   Wt 98.9 kg Comment: scale a   SpO2 95%   BMI 29.57 kg/m  Pain Scale: 0-10   Pain Score: 0-No pain  SpO2: SpO2: 95 % O2 Device:SpO2: 95 % O2 Flow Rate: .O2 Flow Rate (L/min): 0.5 L/min  IO: Intake/output summary:   Intake/Output Summary (Last 24 hours) at 09/06/2019 0653 Last data filed at 09/06/2019 0271 Gross per 24 hour  Intake 710 ml  Output 6350 ml  Net -5640 ml   LBM: Last BM Date: 09/05/19 Baseline Weight: Weight: 99.8 kg Most recent weight: Weight: 98.9 kg(scale a )     Palliative Assessment/Data: 50%   Time In: 0705 Time Out: 0815 Time Total: 70 Greater than 50%  of this time was spent counseling and coordinating care related to the above assessment and plan.  Signed by: Rosezella Rumpf, NP   Please contact Palliative Medicine Team phone at [phone] for questions and concerns.  For individual provider: See Shea Evans

## 2019-09-06 NOTE — Care Management (Signed)
Spoke w Palliative services, family, [facility] home hospice. Plan will be to proceed with home hospice referral.

## 2019-09-06 NOTE — Progress Notes (Addendum)
PROGRESS NOTE    Chase [name]  BJS:[id number] DOB: 03/11/1933 DOA: 09/02/2019 PCP: Mayra Neer, MD    Brief Narrative:  HPI per Dr. Rene [name] Chase [name] is a 86 y.o. male past medical history significant for chronic kidney disease stage IV,  prior cr 2.0 2013, diabetes, peripheral vascular disease, history of PE, who presents complaining of worsening shortness of breath, on and off for the last several hour month.  Patient reports shortness of breath even walking from his house to the garage, he gave out his leg he gets worsening shortness of breath and he has to stop.  He also report episode of chest pains on and off last a few minutes sharp.  Last episode couple of weeks ago.  He denies currently chest pain. He denies fever or cough.  He does report some abdominal pain and distention.  Evaluation in the ED: Sodium 142, potassium 4.3, chloride 114, CO2 13, glucose 204, BUN 115, creatinine 6.2, calcium 7.7, albumin 3.3, AST 7 ALT 11, total protein 6.4, BNP 441, troponin 94, hemoglobin 8.8, white blood cell 3.8, platelets 128, INR 6.3, SARS coronavirus 2 -.  Chest x-ray: Consolidation involving much of the right middle and lower lobes.  Bilateral pleural effusions, larger on the right than the left. Heart mildly enlarged, stable. No adenopathy.Radiopaque structure in the medial right base of uncertain etiology. This structure measures 4.4 x 2.6 cm. Its etiology is uncertain. Correlation with lateral chest radiograph potentially could be helpful for further assessment.   Assessment & Plan:   Principal Problem:   Acute on chronic respiratory failure with hypoxia (HCC) Active Problems:   Acute on chronic diastolic CHF (congestive heart failure) (HCC)   Acute renal failure superimposed on stage 4 chronic kidney disease ([name])   Long term (current) use of anticoagulants   History of pulmonary embolism   Essential hypertension   Hyperlipidemia   Chronic renal insufficiency, stage 4  (severe) (HCC)   Moderate aortic stenosis   Bilateral lower extremity edema   Renal failure   AKI (acute kidney injury) ([name])   Metabolic acidosis   CAP (community acquired pneumonia): Probable   Elevated troponin   Supratherapeutic INR   Palliative care by specialist   Goals of care, counseling/discussion   DNR (do not resuscitate)   Generalized pain   Dyspnea and respiratory abnormalities   Muscular weakness  1 acute on chronic respiratory failure with hypoxia secondary to acute on chronic diastolic CHF exacerbation versus progressive chronic kidney disease stage V Patient presented to the ED with worsening shortness of breath with lower extremity edema bilaterally.  Patient with volume overload on examination and per x-ray findings with bilateral pleural effusions.  Concerned that patient may be having progressive worsening renal function leading to volume overload.  Patient was placed on Lasix 60 mg IV every 12 with poor urine output and Lasix increased to 120 mg IV every 12 hours per nephrology recommendations.  Patient with urine output that is picking up with a urine output of 6.350 L over the past 24 hours.  Patient slowly clinically improving.  Creatinine starting to trend back down. Patient has been seen by nephrology and patient noted to have no interest in pursuing short or long-term dialysis.  Continue current dose of IV Lasix and if no significant improvement may need to transition to hospice.  Palliative care consultation pending.  Nephrology following and appreciate input and recommendations.  2.  Acute on chronic kidney disease stage IV versus progressive  chronic kidney disease stage V Patient noted to have a baseline creatinine of approximately 4.  On admission creatinine was up to 6.  Creatinine slowly trending down and currently at 5.4.  Patient with evidence of volume overload with lower extremity edema, shortness of breath, bilateral pleural effusion on chest x-ray, acidosis  noted on labs.  Patient states his outpatient nephrologist had talked to him about hemodialysis however seems to be less inclined to go on dialysis.  Patient initially was on Lasix 60 mg IV every 12 hours with no significant urine output and Lasix increased to 120 mg IV every 8 hours with urine output that was recorded of 6.350 L over the past 24 hours.  Patient is - 7.454 L during this hospitalization.  Urine output picking up.  Current weight of 98.9 kg from 103.2 kg from 105.6 kg from 106.5 kg from 107.7 kg from 99.8 kg.  Patient with acidosis however now hyponatremic and as such we will decrease bicarb tablets to twice daily.  Patient seen in consultation by nephrology patient has no interest in pursuing short-term dialysis and as such we will continue with high-dose IV Lasix and antibiotic treatment.  Per nephrology if patient fails to improve will likely need to transition to hospice.  Palliative care has been consulted and patient seen by palliative care today palliative care per their note states family interested in speaking with hospice and as such hospice consulted per palliative care..  Nephrology following and appreciate input and recommendations.  3.  Community-acquired pneumonia Noted on chest x-ray and CT chest.  Patient currently afebrile.  Patient had presented with worsening shortness of breath.  Urine strep pneumococcus antigen negative.  Urine Legionella antigen negative.  Sputum Gram stain and cultures pending.  DC IV Rocephin and azithromycin and placed on oral Omnicef to complete a course of antibiotic treatment.  Supportive care.  4.  Positive troponin/elevated troponins Patient with some complaints of chest pain that he describes as a sharp pain that has been intermittent over the past several weeks.  Patient denies any ongoing chest pain.  Cardiac enzymes with high-sensitivity troponins of 94>>:> 97.  Troponin seem to be plateauing and likely demand ischemia secondary to volume  overload.  Patient denies any ongoing acute chest pain.  2D echo with a EF of 65 to 70%, moderately increased LVH, grade 2 diastolic dysfunction, no wall motion abnormalities, severe thickening of aortic valve and severe calcification of aortic valve.  CT chest which was done with severe three-vessel coronary vascular calcification and aortic atherosclerosis.  Patient denies any ongoing acute chest pain.  Curb sided cardiology, likely needs outpatient follow-up.  Follow.  5.  Iron deficiency anemia/anemia of chronic disease H&H stable at 8.6.  Status post aranesp and  IV Feraheme per nephrology.  Follow.  6.  Supratherapeutic INR INR at 1.8 from 2.5 from 6.0 from 6.6 from 6.3 on admission.  Status post vitamin K.  Patient with no overt bleeding.  Coumadin was being held however will resume.  Coumadin per pharmacy.   7.  History of PE on chronic Coumadin INR supratherapeutic at admission.  Patient received some vitamin K.  INR down to 1.8.  Coumadin per pharmacy.  8.  Right hip pain Plain films negative for any fracture.  9.  Diabetes mellitus type 2 Hemoglobin A1c of 7.0.  CBG of 83 this morning.  Lantus was decreased to 10 units daily.  Sliding scale insulin.  10.  Hypernatremia Decrease bicarb tablets to twice daily  and follow.  11.  Hypokalemia Replete.   DVT prophylaxis: Prophylactic Heparin until INR therapeutic.  Code Status: Full Family Communication: Updated patient.  No family at bedside. Disposition Plan: To be determined.   Consultants:   Nephrology: Dr. Joelyn Oms 09/03/2019  Palliative care: Tacey Ruiz, NP 09/06/2019  Hospice pending  Procedures:   Renal ultrasound 09/03/2019  2D echo 09/03/2019  Chest x-ray 09/02/2019  Plain films of the right hip and pelvis 09/02/2019  Antimicrobials:   IV Rocephin 09/03/2019>>>> 09/06/2019  IV azithromycin 09/03/2019>>>>> 09/06/2019  Omnicef 09/06/2019   Subjective: Patient laying in bed she is a little disoriented  this morning however knows who I am.  Denies any chest pain.  Feels breathing is slowly improving.  States Voltaren gel helped with pain in his legs.    Objective: Vitals:   09/05/19 1219 09/05/19 1936 09/06/19 0315 09/06/19 0341  BP: (!) 152/57 (!) 149/68 135/64   Pulse: 95 (!) 101 92   Resp: 19 19 19    Temp: 97.7 F (36.5 C) 98.6 F (37 C) 99.2 F (37.3 C)   TempSrc: Oral Oral Oral   SpO2: 98% 98% 95%   Weight:    98.9 kg  Height:        Intake/Output Summary (Last 24 hours) at 09/06/2019 0907 Last data filed at 09/06/2019 0848 Gross per 24 hour  Intake 710 ml  Output 7050 ml  Net -6340 ml   Filed Weights   09/04/19 0458 09/05/19 0403 09/06/19 0341  Weight: 105.6 kg 103.2 kg 98.9 kg    Examination:  General exam: NAD Respiratory system: Bibasilar crackles. Cardiovascular system: RRR with 3/6 SEM. 1+ pedal edema.  Gastrointestinal system: Abdomen is nontender, nondistended, soft, positive bowel sounds.  No rebound.  No guarding.  Soft, nontender, nondistended, positive bowel sounds.  Central nervous system: Alert and oriented. No focal neurological deficits. Extremities: 1+ pedal edema.  Skin: Bilateral lower extremities with chronic venous stasis changes noted.   Psychiatry: Judgement and insight appear normal. Mood & affect appropriate.     Data Reviewed: I have personally reviewed following labs and imaging studies  CBC: Recent Labs  Lab 09/02/19 1351 09/03/19 0404 09/04/19 0411 09/06/19 0553  WBC 3.8* 3.4* 4.0 6.9  NEUTROABS 2.5  --  2.5  --   HGB 8.8* 8.5* 8.3* 8.6*  HCT 27.7* 26.4* 27.4* 27.4*  MCV 89.1 87.7 89.5 87.8  PLT 128* 113* 114* 086*   Basic Metabolic Panel: Recent Labs  Lab 09/02/19 1351 09/03/19 0404 09/04/19 0411 09/05/19 0540 09/06/19 0553  NA 142 144 148* 147* 148*  K 4.3 4.4 4.3 3.7 3.4*  CL 114* 113* 116* 113* 112*  CO2 13* 15* 15* 19* 21*  GLUCOSE 204* 112* 119* 73 87  BUN 115* 116* 114* 109* 99*  CREATININE 6.24* 6.16*  5.84* 5.60* 5.41*  CALCIUM 7.7* 7.8* 7.8* 7.7* 8.3*  PHOS  --   --  9.8* 7.3* 5.9*   GFR: Estimated Creatinine Clearance: 12.4 mL/min (A) (by C-G formula based on SCr of 5.41 mg/dL (H)). Liver Function Tests: Recent Labs  Lab 09/02/19 1351 09/04/19 0411 09/05/19 0540 09/06/19 0553  AST 7*  --   --   --   ALT 11  --   --   --   ALKPHOS 65  --   --   --   BILITOT 0.7  --   --   --   PROT 6.4*  --   --   --   ALBUMIN 3.3*  3.2* 2.9* 3.0*   No results for input(s): LIPASE, AMYLASE in the last 168 hours. No results for input(s): AMMONIA in the last 168 hours. Coagulation Profile: Recent Labs  Lab 09/02/19 1412 09/03/19 0404 09/04/19 0411 09/05/19 0540 09/06/19 0553  INR 6.3* 6.6* 6.0* 2.5* 1.8*   Cardiac Enzymes: No results for input(s): CKTOTAL, CKMB, CKMBINDEX, TROPONINI in the last 168 hours. BNP (last 3 results) No results for input(s): PROBNP in the last 8760 hours. HbA1C: No results for input(s): HGBA1C in the last 72 hours. CBG: Recent Labs  Lab 09/05/19 1221 09/05/19 1654 09/05/19 1735 09/05/19 2121 09/06/19 0630  GLUCAP 104* 70 114* 179* 83   Lipid Profile: No results for input(s): CHOL, HDL, LDLCALC, TRIG, CHOLHDL, LDLDIRECT in the last 72 hours. Thyroid Function Tests: No results for input(s): TSH, T4TOTAL, FREET4, T3FREE, THYROIDAB in the last 72 hours. Anemia Panel: No results for input(s): VITAMINB12, FOLATE, FERRITIN, TIBC, IRON, RETICCTPCT in the last 72 hours. Sepsis Labs: No results for input(s): PROCALCITON, LATICACIDVEN in the last 168 hours.  Recent Results (from the past 240 hour(s))  SARS CORONAVIRUS 2 (TAT 6-24 HRS) Nasopharyngeal Nasopharyngeal Swab     Status: None   Collection Time: 09/02/19  4:34 PM   Specimen: Nasopharyngeal Swab  Result Value Ref Range Status   SARS Coronavirus 2 NEGATIVE NEGATIVE Final    Comment: (NOTE) SARS-CoV-2 target nucleic acids are NOT DETECTED. The SARS-CoV-2 RNA is generally detectable in upper and  lower respiratory specimens during the acute phase of infection. Negative results do not preclude SARS-CoV-2 infection, do not rule out co-infections with other pathogens, and should not be used as the sole basis for treatment or other patient management decisions. Negative results must be combined with clinical observations, patient history, and epidemiological information. The expected result is Negative. Fact Sheet for Patients: SugarRoll.be Fact Sheet for Healthcare Providers: https://www.woods-mathews.com/ This test is not yet approved or cleared by the [place] FDA and  has been authorized for detection and/or diagnosis of SARS-CoV-2 by FDA under an Emergency Use Authorization (EUA). This EUA will remain  in effect (meaning this test can be used) for the duration of the COVID-19 declaration under Section 56 4(b)(1) of the Act, 21 U.S.C. section 360bbb-3(b)(1), unless the authorization is terminated or revoked sooner. Performed at [facility], [place] [address], [place], [place] [address]   Culture, Urine     Status: None   Collection Time: 09/03/19  9:26 AM   Specimen: Urine, Catheterized  Result Value Ref Range Status   Specimen Description URINE, CATHETERIZED  Final   Special Requests NONE  Final   Culture   Final    NO GROWTH Performed at [facility], [address], [place],  [id number]    Report Status 09/04/2019 FINAL  Final         Radiology Studies: No results found.      Scheduled Meds: . acetaminophen  650 mg Oral TID  . calcitRIOL  0.25 mcg Oral QODAY  . cefdinir  300 mg Oral Q24H  . insulin aspart  0-6 Units Subcutaneous TID WC  . insulin glargine  10 Units Subcutaneous Daily  . sodium bicarbonate  1,300 mg Oral TID  . tamsulosin  0.4 mg Oral QPC supper  . tiZANidine  4 mg Oral QHS  . Warfarin - Pharmacist Dosing Inpatient   Does not apply q1800   Continuous Infusions: . sodium  chloride 250 mL (09/03/19 1056)  . furosemide 120 mg (  09/06/19 0459)     LOS: 4 days    Time spent: 40 minutes    Irine Seal, MD [facility]  If 7PM-7AM, please contact night-coverage [email or web address] 09/06/2019, 9:07 AM

## 2019-09-06 NOTE — Progress Notes (Addendum)
ANTICOAGULATION CONSULT NOTE  Pharmacy Consult for warfarin Indication: history of VTE   Patient Measurements: Height: 6' (182.9 cm) Weight: 218 lb (98.9 kg)(scale a ) IBW/kg (Calculated) : 77.6   Vital Signs: Temp: 99.2 F (37.3 C) (01/23 0315) Temp Source: Oral (01/23 0315) BP: 135/64 (01/23 0315) Pulse Rate: 92 (01/23 0315)  Labs: Recent Labs    09/04/19 0411 09/05/19 0540 09/06/19 0553  HGB 8.3*  --  8.6*  HCT 27.4*  --  27.4*  PLT 114*  --  125*  LABPROT 53.6* 26.9* 21.0*  INR 6.0* 2.5* 1.8*  CREATININE 5.84* 5.60* 5.41*    Assessment: 86 yo male admitted with SOB. Pt is on warfarin for hx of VTE. PTA dose is 5 mg daily per [facility] note on 12/3 (35 mg per week). INR on admit was 6.3. Vitamin K sq 2 mg on 1/20 and 1/21 and warfarin held. Last dose of warfarin 1/18. Renal function is poor but relatively stable with serum creatinine between 5-6. Patient noted not to be eating much of his meals.   INR today is sub-therapeutic at 1.8. He did re-start warfarin yesterday with 2.5 mg. His H&H is stable, today at 8.6/27.4, plts low but increasing, today at 125. Of note, azithromycin may increase the therapeutic effect of warfarin resulting in an increased INR, though it is now discontinued.   Goal of Therapy:  INR 2-3 Monitor platelets by anticoagulation protocol: Yes   Plan:  Give warfarin 7.5 mg PO x 1 today Daily INR, s/s bleeding Monitor for signs of bleeding    Thank you,   Eddie Candle, PharmD PGY-1 Pharmacy Resident   Please check amion for clinical pharmacist contact number

## 2019-09-06 NOTE — Progress Notes (Signed)
Admit: 09/02/2019 LOS: 4  65M with progressive CKD5 presenting with dCHF / Hypervolemia / pleural effusions, ? CAP, worsened renal failure.  Subjective:  Marland Kitchen Met with palliative care, exploring hospice . Excellent urine output in past 24 hours, more than 6 L . Serum creatinine and BUN both downtrending . Weights improved . It looks more energetic today  01/22 0701 - 01/23 0700 In: 710 [P.O.:710] Out: 6350 [Urine:6350]  Filed Weights   09/04/19 0458 09/05/19 0403 09/06/19 0341  Weight: 105.6 kg 103.2 kg 98.9 kg    Scheduled Meds: . acetaminophen  650 mg Oral TID  . calcitRIOL  0.25 mcg Oral QODAY  . cefdinir  300 mg Oral Q24H  . insulin aspart  0-6 Units Subcutaneous TID WC  . insulin glargine  10 Units Subcutaneous Daily  . potassium chloride  20 mEq Oral Once  . sodium bicarbonate  1,300 mg Oral BID  . tamsulosin  0.4 mg Oral QPC supper  . tiZANidine  4 mg Oral QHS  . warfarin  7.5 mg Oral ONCE-1800  . Warfarin - Pharmacist Dosing Inpatient   Does not apply q1800   Continuous Infusions: . sodium chloride 250 mL (09/03/19 1056)  . furosemide 120 mg (09/06/19 0459)   PRN Meds:.sodium chloride, diclofenac Sodium, nitroGLYCERIN, ondansetron **OR** ondansetron (ZOFRAN) IV, oxyCODONE  Current Labs: reviewed    Physical Exam:  Blood pressure 135/64, pulse 92, temperature 99.2 F (37.3 C), temperature source Oral, resp. rate 19, height 6' (1.829 m), weight 98.9 kg, SpO2 95 %. GEN: NAD, elderly male, conversant, nl wob ENT: NCAt EYES: EOMI CV: regular, nl s1s2 no rub PULM: bibasilar rhonchi, clear in upper air fields ABD: s/nt/nd SKIN:  Chronic venous stasis changes / hyperpigmentation in LEs EXT:2+ LEE; better  A 1. Progressive CKD5, follows at [facility] with [facility] 2. dCHF exacerbation / hypervolemia / pleural effusion / pulmonary edema 3. ? CAP on CTX/Azithromycin, could be all #2 4. AFib on warfarin 5. Hx/o PE 6. Chronic venous statsis 7. DM2 8. Anemia with features of  IDA and due to #1, status post ESA and IV iron 1/20 9. Metabolic Acidosis on NaHCo3, improving 10. Supratherapeutic INR per [facility], improving  P 1. Nice improvement in GFR and volume status in the past 24 hours, responding well to diuretics currently 2. He plans to move to palliative measures and potential hospice at discharge, I think this is very reasonable would like to have him optimized as much as possible 3. Continue IV Lasix for another 24 hours, consider changing to torsemide 100 mg tomorrow morning 4. He has clearly stated that he would not pursue dialysis which is a very reasonable decision 5. OOB, PT/OT today encouraged 6. We will closely follow along. 7. Daily weights, Daily Renal Panel, Strict I/Os, Avoid nephrotoxins (NSAIDs, judicious IV Contrast)     Pearson Grippe MD 09/06/2019, 11:26 AM  Recent Labs  Lab 09/04/19 0411 09/05/19 0540 09/06/19 0553  NA 148* 147* 148*  K 4.3 3.7 3.4*  CL 116* 113* 112*  CO2 15* 19* 21*  GLUCOSE 119* 73 87  BUN 114* 109* 99*  CREATININE 5.84* 5.60* 5.41*  CALCIUM 7.8* 7.7* 8.3*  PHOS 9.8* 7.3* 5.9*   Recent Labs  Lab 09/02/19 1351 09/02/19 1351 09/03/19 0404 09/04/19 0411 09/06/19 0553  WBC 3.8*   < > 3.4* 4.0 6.9  NEUTROABS 2.5  --   --  2.5  --   HGB 8.8*   < > 8.5* 8.3* 8.6*  HCT 27.7*   < >  26.4* 27.4* 27.4*  MCV 89.1   < > 87.7 89.5 87.8  PLT 128*   < > 113* 114* 125*   < > = values in this interval not displayed.

## 2019-09-06 NOTE — Progress Notes (Addendum)
Manufacturing engineer (ACC)  Received call from [facility] to explain hospice services.  Called dtr and wife and explained hospice services.  TOC manager is unsure if the plan is home with hospice vs HH.    Will follow along once updated by [facility] manager.  Venia Carbon   **Plan to proceed with hospice once discharged.  DME discussed, need to determine if pt is on O2 for comfort--appears to be on 0.5 lpm, will order   **appropriate for hospice at home per Dr. Gilford Rile with [facility]

## 2019-09-07 LAB — RENAL FUNCTION PANEL
Albumin: 2.7 g/dL — ABNORMAL LOW (ref 3.5–5.0)
Anion gap: 14 (ref 5–15)
BUN: 92 mg/dL — ABNORMAL HIGH (ref 8–23)
CO2: 25 mmol/L (ref 22–32)
Calcium: 8.3 mg/dL — ABNORMAL LOW (ref 8.9–10.3)
Chloride: 107 mmol/L (ref 98–111)
Creatinine, Ser: 5.23 mg/dL — ABNORMAL HIGH (ref 0.61–1.24)
GFR calc Af Amer: 11 mL/min — ABNORMAL LOW (ref >60)
GFR calc non Af Amer: 9 mL/min — ABNORMAL LOW (ref >60)
Glucose, Bld: 151 mg/dL — ABNORMAL HIGH (ref 70–99)
Phosphorus: 5.9 mg/dL — ABNORMAL HIGH (ref 2.5–4.6)
Potassium: 3.5 mmol/L (ref 3.5–5.1)
Sodium: 146 mmol/L — ABNORMAL HIGH (ref 135–145)

## 2019-09-07 LAB — CBC
HCT: 29.4 % — ABNORMAL LOW (ref 39.0–52.0)
Hemoglobin: 9 g/dL — ABNORMAL LOW (ref 13.0–17.0)
MCH: 27.4 pg (ref 26.0–34.0)
MCHC: 30.6 g/dL (ref 30.0–36.0)
MCV: 89.6 fL (ref 80.0–100.0)
Platelets: 133 10*3/uL — ABNORMAL LOW (ref 150–400)
RBC: 3.28 MIL/uL — ABNORMAL LOW (ref 4.22–5.81)
RDW: 17.1 % — ABNORMAL HIGH (ref 11.5–15.5)
WBC: 6.9 10*3/uL (ref 4.0–10.5)
nRBC: 0.4 % — ABNORMAL HIGH (ref 0.0–0.2)

## 2019-09-07 LAB — GLUCOSE, CAPILLARY
Glucose-Capillary: 129 mg/dL — ABNORMAL HIGH (ref 70–99)
Glucose-Capillary: 184 mg/dL — ABNORMAL HIGH (ref 70–99)
Glucose-Capillary: 186 mg/dL — ABNORMAL HIGH (ref 70–99)
Glucose-Capillary: 231 mg/dL — ABNORMAL HIGH (ref 70–99)

## 2019-09-07 LAB — PROTIME-INR
INR: 2 — ABNORMAL HIGH (ref 0.8–1.2)
Prothrombin Time: 22.6 seconds — ABNORMAL HIGH (ref 11.4–15.2)

## 2019-09-07 MED ORDER — TORSEMIDE 100 MG PO TABS
100.0000 mg | ORAL_TABLET | Freq: Every day | ORAL | Status: DC
  Administered 2019-09-07 – 2019-09-08 (×2): 100 mg via ORAL
  Filled 2019-09-07 (×2): qty 1

## 2019-09-07 MED ORDER — WARFARIN SODIUM 5 MG PO TABS
5.0000 mg | ORAL_TABLET | Freq: Once | ORAL | Status: AC
  Administered 2019-09-07: 5 mg via ORAL
  Filled 2019-09-07: qty 1

## 2019-09-07 NOTE — Progress Notes (Addendum)
Manufacturing engineer  DME delivered and whenever the pt is medically ready to leave, hospice will arrange to begin services at home.  Venia Carbon RN, BSN, [facility] Liaison  **addendum- 515 pm, notified that family would like a hospital bed. Order placed.

## 2019-09-07 NOTE — Progress Notes (Addendum)
PROGRESS NOTE    Chase [name]  WER:[id number] DOB: 27-Jul-1933 DOA: 09/02/2019 PCP: Mayra Neer, MD    Brief Narrative:  HPI per Dr. Rene Paci [place] [name] is a 86 y.o. male past medical history significant for chronic kidney disease stage IV,  prior cr 2.0 2013, diabetes, peripheral vascular disease, history of PE, who presents complaining of worsening shortness of breath, on and off for the last several hour month.  Patient reports shortness of breath even walking from his house to the garage, he gave out his leg he gets worsening shortness of breath and he has to stop.  He also report episode of chest pains on and off last a few minutes sharp.  Last episode couple of weeks ago.  He denies currently chest pain. He denies fever or cough.  He does report some abdominal pain and distention.  Evaluation in the ED: Sodium 142, potassium 4.3, chloride 114, CO2 13, glucose 204, BUN 115, creatinine 6.2, calcium 7.7, albumin 3.3, AST 7 ALT 11, total protein 6.4, BNP 441, troponin 94, hemoglobin 8.8, white blood cell 3.8, platelets 128, INR 6.3, SARS coronavirus 2 -.  Chest x-ray: Consolidation involving much of the right middle and lower lobes.  Bilateral pleural effusions, larger on the right than the left. Heart mildly enlarged, stable. No adenopathy.Radiopaque structure in the medial right base of uncertain etiology. This structure measures 4.4 x 2.6 cm. Its etiology is uncertain. Correlation with lateral chest radiograph potentially could be helpful for further assessment.   Assessment & Plan:   Principal Problem:   Acute on chronic respiratory failure with hypoxia (HCC) Active Problems:   Acute on chronic diastolic CHF (congestive heart failure) (HCC)   Acute renal failure superimposed on stage 4 chronic kidney disease ([place])   Long term (current) use of anticoagulants   History of pulmonary embolism   Essential hypertension   Hyperlipidemia   Chronic renal insufficiency, stage 4  (severe) (HCC)   Moderate aortic stenosis   Bilateral lower extremity edema   Renal failure   AKI (acute kidney injury) ([place])   Metabolic acidosis   CAP (community acquired pneumonia): Probable   Elevated troponin   Supratherapeutic INR   Palliative care by specialist   Goals of care, counseling/discussion   DNR (do not resuscitate)   Generalized pain   Dyspnea and respiratory abnormalities   Muscular weakness  1 acute on chronic respiratory failure with hypoxia secondary to acute on chronic diastolic CHF exacerbation versus progressive chronic kidney disease stage V Patient presented to the ED with worsening shortness of breath with lower extremity edema bilaterally.  Patient with volume overload on examination and per x-ray findings with bilateral pleural effusions.  Concerned that patient may be having progressive worsening renal function leading to volume overload.  Patient was placed on Lasix 60 mg IV every 12 with poor urine output and Lasix increased to 120 mg IV every 12 hours per nephrology recommendations.  Patient with urine output that is picking up with a urine output of 3.7 L over the past 24 hours.  Patient slowly clinically improving daily.  Creatinine starting to trend back down. Patient has been seen by nephrology and patient noted to have no interest in pursuing short or long-term dialysis.  Per nephrology if continued improvement could likely transition from IV Lasix to oral torsemide 100 mg daily today. Palliative care consultation pending.  Nephrology following and appreciate input and recommendations.  2.  Acute on chronic kidney disease stage IV versus  progressive chronic kidney disease stage V Patient noted to have a baseline creatinine of approximately 4.  Per nephrology creatinine was 4.75 on 07/21/2019 when seen by PCP.  On admission creatinine was up to 6.  Creatinine slowly trending down and currently at 5.23.  Patient with evidence of volume overload with lower  extremity edema, shortness of breath, bilateral pleural effusion on chest x-ray, acidosis noted on labs on presentation.  Patient stated his outpatient nephrologist had talked to him about hemodialysis however seems to be less inclined to go on dialysis.  Patient initially was on Lasix 60 mg IV every 12 hours with no significant urine output and Lasix increased to 120 mg IV every 8 hours with urine output that was recorded of 3.7 L over the past 24 hours.  Patient is -9.129 L during this hospitalization.  Urine output picking up.  Current weight of 95 kg from 98.9 kg from 103.2 kg from 105.6 kg from 106.5 kg from 107.7 kg from 99.8 kg.  Patient with acidosis however now hypernatremic and as such bicarb tablets were decreased to twice daily.  Acidosis improved.  Hyponatremia improving.  Patient seen in consultation by nephrology patient has no interest in pursuing short-term dialysis and as such we will continue with high-dose IV Lasix and antibiotic treatment.  Patient improving clinically with creatinine trending down and pending nephrology could likely transition to oral Demadex 100 mg daily today.  Per nephrology if patient fails to improve will likely need to transition to hospice.  Palliative care has been consulted and patient seen by palliative care today palliative care per their note states family interested in speaking with hospice and as such hospice consulted per palliative care..  Nephrology following and appreciate input and recommendations.  3.  Community-acquired pneumonia Noted on chest x-ray and CT chest.  Patient currently afebrile.  Patient had presented with worsening shortness of breath.  Urine strep pneumococcus antigen negative.  Urine Legionella antigen negative.  Sputum Gram stain and cultures pending.  IV Rocephin and IV azithromycin have been discontinued and patient currently on oral Omnicef to complete a course of antibiotic treatment.  Supportive care.  4.  Positive  troponin/elevated troponins Patient with some complaints of chest pain that he describes as a sharp pain that has been intermittent over the past several weeks.  Patient denies any ongoing chest pain.  Cardiac enzymes with high-sensitivity troponins of 94>>:> 97.  Troponin seem to be plateauing and likely demand ischemia secondary to volume overload.  Patient denies any ongoing acute chest pain.  2D echo with a EF of 65 to 70%, moderately increased LVH, grade 2 diastolic dysfunction, no wall motion abnormalities, severe thickening of aortic valve and severe calcification of aortic valve.  CT chest which was done with severe three-vessel coronary vascular calcification and aortic atherosclerosis.  Patient denies any ongoing acute chest pain.  Curb sided cardiology, likely needs outpatient follow-up.  Follow.  5.  Iron deficiency anemia/anemia of chronic disease H&H stable at 9.  Status post aranesp and  IV Feraheme per nephrology.  Follow.  6.  Supratherapeutic INR INR at 2.0 from 1.8 from 2.5 from 6.0 from 6.6 from 6.3 on admission.  Status post vitamin K.  Patient with no overt bleeding.  Coumadin has been resumed.  Coumadin per pharmacy.   7.  History of PE on chronic Coumadin INR supratherapeutic at admission.  Patient received some vitamin K.  INR trending back up and currently at 2.0 after Coumadin resumed last night  per pharmacy.  Goal INR 2-3.  8.  Right hip pain Plain films negative for any fracture.  9.  Diabetes mellitus type 2 Hemoglobin A1c of 7.0.  CBG of 129 this morning.  Continue Lantus at 10 units daily.  Sliding scale insulin.   10.  Hypernatremia Improving with decreased bicarb tablets.  Continue current dose of bicarb tablets.  Will defer to nephrology as to whether to decrease further.   11.  Hypokalemia Repleted.  Potassium at 3.5..   DVT prophylaxis: Prophylactic Heparin until INR therapeutic.  Code Status: Full Family Communication: Updated patient.  No family at  bedside. Disposition Plan: To be determined.  Either home with home health versus home with hospice.   Consultants:   Nephrology: Dr. Joelyn Oms 09/03/2019  Palliative care: Tacey Ruiz, NP 09/06/2019  Hospice pending  Procedures:   Renal ultrasound 09/03/2019  2D echo 09/03/2019  Chest x-ray 09/02/2019  Plain films of the right hip and pelvis 09/02/2019  Antimicrobials:   IV Rocephin 09/03/2019>>>> 09/06/2019  IV azithromycin 09/03/2019>>>>> 09/06/2019  Omnicef 09/06/2019   Subjective: Patient sleeping however easily arousable.  Feeling better today.  States shortness of breath has improved.  Denies any chest pain.    Objective: Vitals:   09/06/19 0341 09/06/19 1235 09/06/19 2055 09/07/19 0608  BP:  (!) 143/61 (!) 153/87 101/63  Pulse:  93 92 84  Resp:   20 18  Temp:  98 F (36.7 C) 97.8 F (36.6 C) 98.7 F (37.1 C)  TempSrc:   Oral Oral  SpO2:  98% 99% 95%  Weight: 98.9 kg   95 kg  Height:        Intake/Output Summary (Last 24 hours) at 09/07/2019 0915 Last data filed at 09/07/2019 0824 Gross per 24 hour  Intake 1280 ml  Output 3225 ml  Net -1945 ml   Filed Weights   09/05/19 0403 09/06/19 0341 09/07/19 3845  Weight: 103.2 kg 98.9 kg 95 kg    Examination:  General exam: NAD Respiratory system: Clear to auscultation bilaterally.  No wheezes, no crackles, no rhonchi.  Cardiovascular system: RRR with 3/6 SEM.  Trace pedal edema.  Gastrointestinal system: Abdomen is soft, nontender, nondistended, positive bowel sounds.  No rebound.  No guarding.  Nontender, nondistended, soft, positive bowel sounds. Central nervous system: Alert and oriented. No focal neurological deficits. Extremities: Trace pedal edema.  Skin: Bilateral lower extremities with chronic venous stasis changes noted.   Psychiatry: Judgement and insight appear normal. Mood & affect appropriate.     Data Reviewed: I have personally reviewed following labs and imaging studies  CBC: Recent  Labs  Lab 09/02/19 1351 09/03/19 0404 09/04/19 0411 09/06/19 0553 09/07/19 0519  WBC 3.8* 3.4* 4.0 6.9 6.9  NEUTROABS 2.5  --  2.5  --   --   HGB 8.8* 8.5* 8.3* 8.6* 9.0*  HCT 27.7* 26.4* 27.4* 27.4* 29.4*  MCV 89.1 87.7 89.5 87.8 89.6  PLT 128* 113* 114* 125* 364*   Basic Metabolic Panel: Recent Labs  Lab 09/03/19 0404 09/04/19 0411 09/05/19 0540 09/06/19 0553 09/07/19 0519  NA 144 148* 147* 148* 146*  K 4.4 4.3 3.7 3.4* 3.5  CL 113* 116* 113* 112* 107  CO2 15* 15* 19* 21* 25  GLUCOSE 112* 119* 73 87 151*  BUN 116* 114* 109* 99* 92*  CREATININE 6.16* 5.84* 5.60* 5.41* 5.23*  CALCIUM 7.8* 7.8* 7.7* 8.3* 8.3*  PHOS  --  9.8* 7.3* 5.9* 5.9*   GFR: Estimated Creatinine Clearance: 12.6 mL/min (  A) (by C-G formula based on SCr of 5.23 mg/dL (H)). Liver Function Tests: Recent Labs  Lab 09/02/19 1351 09/04/19 0411 09/05/19 0540 09/06/19 0553 09/07/19 0519  AST 7*  --   --   --   --   ALT 11  --   --   --   --   ALKPHOS 65  --   --   --   --   BILITOT 0.7  --   --   --   --   PROT 6.4*  --   --   --   --   ALBUMIN 3.3* 3.2* 2.9* 3.0* 2.7*   No results for input(s): LIPASE, AMYLASE in the last 168 hours. No results for input(s): AMMONIA in the last 168 hours. Coagulation Profile: Recent Labs  Lab 09/03/19 0404 09/04/19 0411 09/05/19 0540 09/06/19 0553 09/07/19 0519  INR 6.6* 6.0* 2.5* 1.8* 2.0*   Cardiac Enzymes: No results for input(s): CKTOTAL, CKMB, CKMBINDEX, TROPONINI in the last 168 hours. BNP (last 3 results) No results for input(s): PROBNP in the last 8760 hours. HbA1C: No results for input(s): HGBA1C in the last 72 hours. CBG: Recent Labs  Lab 09/06/19 0630 09/06/19 1142 09/06/19 1630 09/06/19 2123 09/07/19 0638  GLUCAP 83 167* 202* 208* 129*   Lipid Profile: No results for input(s): CHOL, HDL, LDLCALC, TRIG, CHOLHDL, LDLDIRECT in the last 72 hours. Thyroid Function Tests: No results for input(s): TSH, T4TOTAL, FREET4, T3FREE, THYROIDAB  in the last 72 hours. Anemia Panel: No results for input(s): VITAMINB12, FOLATE, FERRITIN, TIBC, IRON, RETICCTPCT in the last 72 hours. Sepsis Labs: No results for input(s): PROCALCITON, LATICACIDVEN in the last 168 hours.  Recent Results (from the past 240 hour(s))  SARS CORONAVIRUS 2 (TAT 6-24 HRS) Nasopharyngeal Nasopharyngeal Swab     Status: None   Collection Time: 09/02/19  4:34 PM   Specimen: Nasopharyngeal Swab  Result Value Ref Range Status   SARS Coronavirus 2 NEGATIVE NEGATIVE Final    Comment: (NOTE) SARS-CoV-2 target nucleic acids are NOT DETECTED. The SARS-CoV-2 RNA is generally detectable in upper and lower respiratory specimens during the acute phase of infection. Negative results do not preclude SARS-CoV-2 infection, do not rule out co-infections with other pathogens, and should not be used as the sole basis for treatment or other patient management decisions. Negative results must be combined with clinical observations, patient history, and epidemiological information. The expected result is Negative. Fact Sheet for Patients: SugarRoll.be Fact Sheet for Healthcare Providers: https://www.woods-mathews.com/ This test is not yet approved or cleared by the [place] FDA and  has been authorized for detection and/or diagnosis of SARS-CoV-2 by FDA under an Emergency Use Authorization (EUA). This EUA will remain  in effect (meaning this test can be used) for the duration of the COVID-19 declaration under Section 56 4(b)(1) of the Act, 21 U.S.C. section 360bbb-3(b)(1), unless the authorization is terminated or revoked sooner. Performed at [facility], [place] [address], [place], [place] [address]   Culture, Urine     Status: None   Collection Time: 09/03/19  9:26 AM   Specimen: Urine, Catheterized  Result Value Ref Range Status   Specimen Description URINE, CATHETERIZED  Final   Special Requests NONE  Final   Culture    Final    NO GROWTH Performed at [facility], [address], [place], [place] [address]    Report Status 09/04/2019 FINAL  Final         Radiology Studies:  No results found.      Scheduled Meds: . acetaminophen  650 mg Oral TID  . calcitRIOL  0.25 mcg Oral QODAY  . cefdinir  300 mg Oral Q24H  . insulin aspart  0-6 Units Subcutaneous TID WC  . insulin glargine  10 Units Subcutaneous Daily  . sodium bicarbonate  1,300 mg Oral BID  . tamsulosin  0.4 mg Oral QPC supper  . tiZANidine  4 mg Oral QHS  . torsemide  100 mg Oral Daily  . Warfarin - Pharmacist Dosing Inpatient   Does not apply q1800   Continuous Infusions: . sodium chloride 250 mL (09/03/19 1056)     LOS: 5 days    Time spent: 40 minutes    Irine Seal, MD [facility]  If 7PM-7AM, please contact night-coverage [email or web address] 09/07/2019, 9:14 AM

## 2019-09-07 NOTE — Progress Notes (Signed)
SATURATION QUALIFICATIONS: (This note is used to comply with regulatory documentation for home oxygen)  Patient Saturations on Room Air at Rest = 97%  Patient Saturations on Room Air while Ambulating = 97%  Patient Saturations on 1 Liters of oxygen while Ambulating = 98%

## 2019-09-07 NOTE — Progress Notes (Signed)
ANTICOAGULATION CONSULT NOTE  Pharmacy Consult for warfarin Indication: history of VTE   Patient Measurements: Height: 6' (182.9 cm) Weight: 209 lb 8 oz (95 kg) IBW/kg (Calculated) : 77.6   Vital Signs: Temp: 98.7 F (37.1 C) (01/24 0608) Temp Source: Oral (01/24 0608) BP: 101/63 (01/24 2440) Pulse Rate: 84 (01/24 0608)  Labs: Recent Labs    09/05/19 0540 09/06/19 0553 09/07/19 0519  HGB  --  8.6* 9.0*  HCT  --  27.4* 29.4*  PLT  --  125* 133*  LABPROT 26.9* 21.0* 22.6*  INR 2.5* 1.8* 2.0*  CREATININE 5.60* 5.41* 5.23*    Assessment: 86 yo male admitted with SOB. Pt is on warfarin for hx of VTE. PTA dose is 5 mg daily per Coumadin Clinic note on 12/3 (35 mg per week). INR on admit was 6.3. Vitamin K sq 2 mg on 1/20 and 1/21 and warfarin held. Last dose of warfarin 1/18. Renal function is poor but relatively stable with serum creatinine between 5-6. Patient noted not to be eating much of his meals.   INR today is therapeutic at 2.0. His H&H is stable at 9.0/29.4, plts are low but increasing at 133. Of note, he was started on subcutaneous heparin yesterday due to subtherapeutic INR, but this has since been discontinued.    Goal of Therapy:  INR 2-3 Monitor platelets by anticoagulation protocol: Yes   Plan:  Give warfarin 5 mg PO x 1 today Daily INR, s/s bleeding Monitor for signs of bleeding    Thank you,   Eddie Candle, PharmD PGY-1 Pharmacy Resident   Please check amion for clinical pharmacist contact number

## 2019-09-07 NOTE — Progress Notes (Signed)
Admit: 09/02/2019 LOS: 5  36M with progressive CKD5 presenting with dCHF / Hypervolemia / pleural effusions, ? CAP, worsened renal failure.  Subjective:  . Feels good, ongoing excellent response to diuretics, BUN and creatinine improving . Weights are improved  01/23 0701 - 01/24 0700 In: 920 [P.O.:920] Out: 3700 [Urine:3700]  Filed Weights   09/05/19 0403 09/06/19 0341 09/07/19 7017  Weight: 103.2 kg 98.9 kg 95 kg    Scheduled Meds: . acetaminophen  650 mg Oral TID  . calcitRIOL  0.25 mcg Oral QODAY  . cefdinir  300 mg Oral Q24H  . insulin aspart  0-6 Units Subcutaneous TID WC  . insulin glargine  10 Units Subcutaneous Daily  . sodium bicarbonate  1,300 mg Oral BID  . tamsulosin  0.4 mg Oral QPC supper  . tiZANidine  4 mg Oral QHS  . torsemide  100 mg Oral Daily  . warfarin  5 mg Oral ONCE-1800  . Warfarin - Pharmacist Dosing Inpatient   Does not apply q1800   Continuous Infusions: . sodium chloride 250 mL (09/03/19 1056)   PRN Meds:.sodium chloride, diclofenac Sodium, nitroGLYCERIN, ondansetron **OR** ondansetron (ZOFRAN) IV, oxyCODONE  Current Labs: reviewed    Physical Exam:  Blood pressure 101/63, pulse 84, temperature 98.7 F (37.1 C), temperature source Oral, resp. rate 18, height 6' (1.829 m), weight 95 kg, SpO2 95 %. GEN: NAD, elderly male, conversant, nl wob ENT: NCAt EYES: EOMI CV: regular, nl s1s2 no rub PULM: bibasilar rhonchi, clear in upper air fields ABD: s/nt/nd SKIN:  Chronic venous stasis changes / hyperpigmentation in LEs EXT: 1+ LEE; better  A 1. Progressive CKD5, follows at [facility] with [facility] 2. dCHF exacerbation / hypervolemia / pleural effusion / pulmonary edema; improving 3. ? CAP s/p CTX/Azithromycin, could be all #2, now on cefdinir 4. AFib on warfarin 5. Hx/o PE 6. Chronic venous statsis 7. DM2 8. Anemia with features of IDA and due to #1, status post ESA and IV iron 1/20 9. Metabolic Acidosis on NaHCo3, improving 10. Supratherapeutic  INR per TRH, improving  P  Convert to oral torsemide this morning, 100 mg each morning  I think if stable labs and satisfactory volume status tomorrow could potentially discharge  He plans to move to palliative measures and potential hospice at discharge, I think this is very reasonable would like to have him optimized as much as possible  He has clearly stated that he would not pursue dialysis which is a very reasonable decision  OOB, PT/OT today encouraged  We will closely follow along.  Daily weights, Daily Renal Panel, Strict I/Os, Avoid nephrotoxins (NSAIDs, judicious IV Contrast)     Pearson Grippe MD 09/07/2019, 11:04 AM  Recent Labs  Lab 09/05/19 0540 09/06/19 0553 09/07/19 0519  NA 147* 148* 146*  K 3.7 3.4* 3.5  CL 113* 112* 107  CO2 19* 21* 25  GLUCOSE 73 87 151*  BUN 109* 99* 92*  CREATININE 5.60* 5.41* 5.23*  CALCIUM 7.7* 8.3* 8.3*  PHOS 7.3* 5.9* 5.9*   Recent Labs  Lab 09/02/19 1351 09/03/19 0404 09/04/19 0411 09/06/19 0553 09/07/19 0519  WBC 3.8*   < > 4.0 6.9 6.9  NEUTROABS 2.5  --  2.5  --   --   HGB 8.8*   < > 8.3* 8.6* 9.0*  HCT 27.7*   < > 27.4* 27.4* 29.4*  MCV 89.1   < > 89.5 87.8 89.6  PLT 128*   < > 114* 125* 133*   < > =  values in this interval not displayed.

## 2019-09-08 ENCOUNTER — Inpatient Hospital Stay (HOSPITAL_COMMUNITY): Payer: Medicare Other

## 2019-09-08 DIAGNOSIS — I10 Essential (primary) hypertension: Secondary | ICD-10-CM

## 2019-09-08 DIAGNOSIS — R600 Localized edema: Secondary | ICD-10-CM

## 2019-09-08 LAB — RENAL FUNCTION PANEL
Albumin: 2.6 g/dL — ABNORMAL LOW (ref 3.5–5.0)
Anion gap: 16 — ABNORMAL HIGH (ref 5–15)
BUN: 91 mg/dL — ABNORMAL HIGH (ref 8–23)
CO2: 28 mmol/L (ref 22–32)
Calcium: 8.1 mg/dL — ABNORMAL LOW (ref 8.9–10.3)
Chloride: 104 mmol/L (ref 98–111)
Creatinine, Ser: 5.13 mg/dL — ABNORMAL HIGH (ref 0.61–1.24)
GFR calc Af Amer: 11 mL/min — ABNORMAL LOW (ref >60)
GFR calc non Af Amer: 10 mL/min — ABNORMAL LOW (ref >60)
Glucose, Bld: 154 mg/dL — ABNORMAL HIGH (ref 70–99)
Phosphorus: 5.1 mg/dL — ABNORMAL HIGH (ref 2.5–4.6)
Potassium: 3.2 mmol/L — ABNORMAL LOW (ref 3.5–5.1)
Sodium: 148 mmol/L — ABNORMAL HIGH (ref 135–145)

## 2019-09-08 LAB — HEMOGLOBIN AND HEMATOCRIT, BLOOD
HCT: 30.7 % — ABNORMAL LOW (ref 39.0–52.0)
Hemoglobin: 9.4 g/dL — ABNORMAL LOW (ref 13.0–17.0)

## 2019-09-08 LAB — GLUCOSE, CAPILLARY
Glucose-Capillary: 129 mg/dL — ABNORMAL HIGH (ref 70–99)
Glucose-Capillary: 212 mg/dL — ABNORMAL HIGH (ref 70–99)

## 2019-09-08 LAB — PROTIME-INR
INR: 2.4 — ABNORMAL HIGH (ref 0.8–1.2)
Prothrombin Time: 26.2 seconds — ABNORMAL HIGH (ref 11.4–15.2)

## 2019-09-08 MED ORDER — TORSEMIDE 100 MG PO TABS: 100.0000 mg | ORAL_TABLET | Freq: Every day | ORAL | 1 refills | Status: AC

## 2019-09-08 MED ORDER — ACETAMINOPHEN 325 MG PO TABS: 500.0000 mg | ORAL_TABLET | Freq: Three times a day (TID) | ORAL | Status: AC

## 2019-09-08 MED ORDER — METOPROLOL TARTRATE 50 MG PO TABS: 25.0000 mg | ORAL_TABLET | Freq: Two times a day (BID) | ORAL | Status: AC

## 2019-09-08 MED ORDER — WARFARIN SODIUM 2.5 MG PO TABS: 2.5000 mg | ORAL_TABLET | Freq: Once | ORAL | Status: DC

## 2019-09-08 MED ORDER — CEFDINIR 300 MG PO CAPS: 300.0000 mg | ORAL_CAPSULE | ORAL | 0 refills | Status: AC

## 2019-09-08 MED ORDER — OXYCODONE HCL 5 MG PO TABS: 2.5000 mg | ORAL_TABLET | ORAL | 0 refills | Status: AC | PRN

## 2019-09-08 MED ORDER — DICLOFENAC SODIUM 1 % EX GEL: 2.0000 g | Freq: Four times a day (QID) | CUTANEOUS | 1 refills | Status: AC | PRN

## 2019-09-08 MED ORDER — LANTUS SOLOSTAR 100 UNIT/ML ~~LOC~~ SOPN: 10.0000 [IU] | PEN_INJECTOR | Freq: Every day | SUBCUTANEOUS | 1 refills | Status: AC

## 2019-09-08 MED ORDER — POTASSIUM CHLORIDE CRYS ER 20 MEQ PO TBCR
40.0000 meq | EXTENDED_RELEASE_TABLET | Freq: Once | ORAL | Status: AC
  Administered 2019-09-08: 40 meq via ORAL
  Filled 2019-09-08: qty 2

## 2019-09-08 MED ORDER — NITROGLYCERIN 0.4 MG SL SUBL: 0.4000 mg | SUBLINGUAL_TABLET | SUBLINGUAL | 0 refills | Status: AC | PRN

## 2019-09-08 MED ORDER — WARFARIN SODIUM 5 MG PO TABS: 2.5000 mg | ORAL_TABLET | Freq: Every day | ORAL | 0 refills | Status: AC

## 2019-09-08 MED ORDER — POTASSIUM CHLORIDE CRYS ER 20 MEQ PO TBCR: 20.0000 meq | EXTENDED_RELEASE_TABLET | Freq: Every day | ORAL | 1 refills | Status: AC

## 2019-09-08 NOTE — Care Management Important Message (Signed)
Important Message  Patient Details  Name: Chase [name] MRN: [id number] Date of Birth: February 27, 1933   Medicare Important Message Given:  Yes     Shelda Altes 09/08/2019, 12:44 PM

## 2019-09-08 NOTE — TOC Transition Note (Signed)
Transition of Care [facility]) - CM/SW Discharge Note   Patient Details  Name: ZAYVIEN CANNING MRN: [id number] Date of Birth: [birth year]  [facility]) CM/SW Contact:  Zenon Mayo, RN Phone Number: 09/08/2019, 2:07 PM   Clinical Narrative:    Patient is for dc home today with hospice with [facility], NCM contacted Harmon Pier to let her know.  NCM contacted Lattie Haw  To let her know , she states she will have her brother to come an pick patient up.    Final next level of care: Home w Hospice Care Barriers to Discharge: No Barriers Identified   Patient Goals and CMS Choice Patient states their goals for this hospitalization and ongoing recovery are:: home with hospice      Discharge Placement                       Discharge Plan and Services                DME Arranged: (Hospice has arranged the DME)         HH Arranged: RN [place] Agency: [facility]) Date HH Agency Contacted: 09/08/19 Time [place]: 1406 Representative spoke with at [place]: [place] ([place]) Interventions     Readmission Risk Interventions No flowsheet data found.

## 2019-09-08 NOTE — Discharge Summary (Signed)
Physician Discharge Summary  Chase [name] IFO:[id number] DOB: 01/16/1933 DOA: 09/02/2019  PCP: Mayra Neer, MD  Admit date: 09/02/2019 Discharge date: 09/08/2019  Time spent: 55 minutes  Recommendations for Outpatient Follow-up:  1. Follow-up with Dr. Hollie Salk, nephrology in 1 week.  On follow-up patient will need a renal panel done to follow-up on electrolytes, renal function and further management of his progressive chronic kidney disease. 2. Follow-up at Coumadin clinic as scheduled on 09/11/2019 3. Follow-up with Mayra Neer, MD in 2 weeks. 4. Patient to be discharged home with hospice.     Discharge Diagnoses:  Principal Problem:   Acute on chronic respiratory failure with hypoxia (HCC) Active Problems:   Acute on chronic diastolic CHF (congestive heart failure) (HCC)   Acute renal failure superimposed on stage 4 chronic kidney disease ([place])   Long term (current) use of anticoagulants   History of pulmonary embolism   Essential hypertension   Hyperlipidemia   Chronic renal insufficiency, stage 4 (severe) (HCC)   Moderate aortic stenosis   Bilateral lower extremity edema   Renal failure   AKI (acute kidney injury) ([place])   Metabolic acidosis   CAP (community acquired pneumonia): Probable   Elevated troponin   Supratherapeutic INR   Palliative care by specialist   Goals of care, counseling/discussion   DNR (do not resuscitate)   Pain   Dyspnea and respiratory abnormalities   Muscular weakness   Discharge Condition: Stable and improved  Diet recommendation: Heart healthy  Filed Weights   09/06/19 0341 09/07/19 0608 09/08/19 0500  Weight: 98.9 kg 95 kg 94.2 kg    History of present illness:  HPI per Dr. Arlana Hove is a 86 y.o. male past medical history significant for chronic kidney disease stage IV,  prior cr 2.0 2013, diabetes, peripheral vascular disease, history of PE, who presented complaining of worsening shortness of breath, on and off for  the last several hour month.  Patient reported shortness of breath even walking from his house to the garage, he gave out his leg he gets worsening shortness of breath and he has to stop.  He also reported episode of chest pains on and off last a few minutes sharp.  Last episode couple of weeks ago.  He denies currently chest pain. He denied fever or cough.  He did report some abdominal pain and distention.  Evaluation in the ED: Sodium 142, potassium 4.3, chloride 114, CO2 13, glucose 204, BUN 115, creatinine 6.2, calcium 7.7, albumin 3.3, AST 7 ALT 11, total protein 6.4, BNP 441, troponin 94, hemoglobin 8.8, white blood cell 3.8, platelets 128, INR 6.3, SARS coronavirus 2 -.  Chest x-ray: Consolidation involving much of the right middle and lower lobes.  Bilateral pleural effusions, larger on the right than the left. Heart mildly enlarged, stable. No adenopathy.Radiopaque structure in the medial right base of uncertain etiology. This structure measures 4.4 x 2.6 cm. Its etiology is uncertain. Correlation with lateral chest radiograph potentially could be helpful for further assessment.   Hospital Course:  1 acute on chronic respiratory failure with hypoxia secondary to acute on chronic diastolic CHF exacerbation versus progressive chronic kidney disease stage V Patient presented to the ED with worsening shortness of breath with lower extremity edema bilaterally.  Patient with volume overload on examination and per x-ray findings with bilateral pleural effusions.  Concerned that patient may be having progressive worsening renal function leading to volume overload.  Patient was placed on Lasix 60 mg IV every 12  with poor urine output and Lasix increased to 120 mg IV every 12 hours per nephrology recommendations.  Patient with urine output and subsequently transition to oral Demadex by day of discharge.  Patient was -11.209 L during this hospitalization.  Creatinine trended down and was down to 5.13 by day  of discharge. Patient has been seen by nephrology and patient noted to have no interest in pursuing short or long-term dialysis.    As patient improved clinically he was subsequently transition to torsemide 100 mg daily which he tolerated.  Palliative care consulted and patient and family interested in hospice and as such hospice consulted.  Patient improved clinically will be discharged home with hospice.  Close outpatient follow-up with nephrology for lab work and further management.  Patient discharged home in stable and improved condition.   2.  Acute on chronic kidney disease stage IV versus progressive chronic kidney disease stage V Patient noted to have a baseline creatinine of approximately 4.  Per nephrology creatinine was 4.75 on 07/21/2019 when seen by PCP.  On admission creatinine was up to 6.  Patient with evidence of volume overload with lower extremity edema, shortness of breath, bilateral pleural effusion on chest x-ray, acidosis noted on labs on presentation.  Patient stated his outpatient nephrologist had talked to him about hemodialysis however seems to be less inclined to go on dialysis.  Patient initially was on Lasix 60 mg IV every 12 hours with no significant urine output and Lasix increased to 120 mg IV every 8 hours with urine output that picked up during the hospitalization and patient started having good urine output.  Patient was -11.209 L by day of discharge.  Patient improved clinically.  Patient's weight was down to 94.2 kg from 107.7 kg on admission.  Patient was seen by nephrology who managed patient's volume overload.  Patient with acidosis early on during the hospitalization that improved and resolved on bicarb tablets.  Patient however became hyponatremic and assessed bicarb tablets were initially decreased and subsequently discontinued by day of discharge.  Acidosis had resolved by day of discharge. Patient seen in consultation by nephrology who stated patient has no interest  in pursuing short-term dialysis and as such was continued on high-dose IV Lasix with antibiotic treatment.  Patient improved clinically and subsequently transition to oral Demadex 100 mg daily which he tolerated with continued clinical improvement and improvement with renal function.  Creatinine trended down and was down to 5.13 by day of discharge.  Palliative care consulted and patient seen by palliative care. Palliative care per their note stated family interested in speaking with hospice and as such hospice consulted was placed.  Family decided to pursue hospice on discharge and as such patient was discharged home with hospice.  Nephrology followed the patient throughout the hospitalization.  Outpatient follow-up with nephrology.   3.  Community-acquired pneumonia Noted on chest x-ray and CT chest.  Patient currently afebrile.  Patient had presented with worsening shortness of breath.  Urine strep pneumococcus antigen negative.  Urine Legionella antigen negative.  Sputum Gram stain and cultures pending with no growth to date.  Patient was initially placed on IV Rocephin and IV azithromycin and subsequently transition to oral Omnicef.  Patient be discharged home on 1 more day of oral Omnicef to complete a 7-day course of antibiotic treatment.  Outpatient follow-up.  4.  Positive troponin/elevated troponins Patient with some complaints of chest pain that he described as a sharp pain that had been intermittent over the past  several weeks.  Patient denied any ongoing chest pain.  Cardiac enzymes with high-sensitivity troponins of 94>>:> 97.  Troponin seem to be plateauing and likely demand ischemia secondary to volume overload.  Patient denied any ongoing acute chest pain.  2D echo with a EF of 65 to 70%, moderately increased LVH, grade 2 diastolic dysfunction, no wall motion abnormalities, severe thickening of aortic valve and severe calcification of aortic valve.  CT chest which was done with severe  three-vessel coronary vascular calcification and aortic atherosclerosis.  Patient denies any ongoing acute chest pain.  Curb sided cardiology, likely needs outpatient follow-up.    Patient however will be discharged home with hospice.    5.  Iron deficiency anemia/anemia of chronic disease H&H remained stable during the hospitalization and was 9.4 by day of discharge.  Patient was followed by nephrology and patient received aranesp and  IV Feraheme per nephrology.    6.  Supratherapeutic INR INR was supratherapeutic on admission and went up as high as 6.6.  Patient received a dose of vitamin K.  INR trended down as low as 1.8.  Patient had no bleeding during the hospitalization.  Coumadin subsequently resumed per pharmacy and INR was 2.4 by day of discharge.  Outpatient follow-up at the Coumadin clinic.    7.  History of PE on chronic Coumadin INR supratherapeutic at admission.  Patient received some vitamin K.  INR trended down as low as 1.8.  Patient was resumed back on home regimen of Coumadin.  INR was 2.4 by day of discharge.  Patient be discharged home on Coumadin 2.5 mg on Monday Wednesdays Fridays and 5 mg on other days.  Patient is to follow-up at the Coumadin clinic as previously scheduled on Thursday, 09/11/2019.    8.  Right hip pain Plain films negative for any fracture.  Outpatient follow-up.  9.  Diabetes mellitus type 2 Hemoglobin A1c of 7.0.    Patient's Lantus dose was decreased to 10 units daily and patient maintained on sliding scale insulin.  Outpatient follow-up.    10.  Hypernatremia Improved initially with decreasing bicarb tablets.  Patient's acidosis had resolved by day of discharge and as such bicarb tablets will be discontinued on discharge.  Patient will need repeat labs done in 1 week.  Patient will follow up with his nephrologist in 1 week.   11.  Hypokalemia Secondary to diuresis.  Potassium repleted and patient be discharged on daily oral potassium  supplementation.  Outpatient follow-up with nephrology with repeat labs in 1 week.   Patient to be discharged home with hospice following.   Procedures:  Renal ultrasound 09/03/2019  2D echo 09/03/2019  Chest x-ray 09/02/2019  Plain films of the right hip and pelvis 09/02/2019   Consultations:  Nephrology: Dr. Joelyn Oms 09/03/2019  Palliative care: Tacey Ruiz, NP 09/06/2019  Hospice  Discharge Exam: Vitals:   09/08/19 0457 09/08/19 1203  BP: 121/64 133/65  Pulse: 97 91  Resp: 18   Temp: 98.7 F (37.1 C) (!) 97.5 F (36.4 C)  SpO2: 95% 94%    General: NAD Cardiovascular: RRR Respiratory: CTAB  Discharge Instructions   Discharge Instructions    Diet - low sodium heart healthy   Complete by: As directed    Increase activity slowly   Complete by: As directed      Allergies as of 09/08/2019      Reactions   Sulfa Antibiotics Other (See Comments)   From childhood- reaction??      Medication List  STOP taking these medications   amLODipine 10 MG tablet Commonly known as: NORVASC   furosemide 40 MG tablet Commonly known as: LASIX   HYDROcodone-acetaminophen 7.5-325 MG tablet Commonly known as: NORCO     TAKE these medications   acetaminophen 325 MG tablet Commonly known as: TYLENOL Take 1.5 tablets (487.5 mg total) by mouth 3 (three) times daily.   allopurinol 100 MG tablet Commonly known as: ZYLOPRIM Take 100 mg by mouth daily.   amoxicillin 500 MG capsule Commonly known as: AMOXIL Take 2,000 mg by mouth See admin instructions. Take 2,000 mg by mouth one hour prior to dental appointments   calcitRIOL 0.25 MCG capsule Commonly known as: ROCALTROL Take 0.25 mcg by mouth every other day.   cefdinir 300 MG capsule Commonly known as: OMNICEF Take 1 capsule (300 mg total) by mouth daily for 1 day. Start taking on: September 09, 2019   diclofenac Sodium 1 % Gel Commonly known as: VOLTAREN Apply 2 g topically every 6 (six) hours as needed  (FOr pain).   diphenhydrAMINE 25 MG tablet Commonly known as: BENADRYL Take 25 mg by mouth at bedtime.   finasteride 5 MG tablet Commonly known as: PROSCAR Take 5 mg by mouth daily.   glimepiride 4 MG tablet Commonly known as: AMARYL Take 2 mg by mouth daily as needed (if insulin is not taken).   indomethacin 25 MG capsule Commonly known as: INDOCIN Take 25 mg by mouth daily as needed for mild pain.   Lantus SoloStar 100 UNIT/ML Solostar Pen Generic drug: Insulin Glargine Inject 10 Units into the skin daily before breakfast. What changed: how much to take   metoprolol tartrate 50 MG tablet Commonly known as: LOPRESSOR Take 0.5 tablets (25 mg total) by mouth 2 (two) times daily. What changed: how much to take   nitroGLYCERIN 0.4 MG SL tablet Commonly known as: NITROSTAT Place 1 tablet (0.4 mg total) under the tongue every 5 (five) minutes as needed for chest pain.   oxyCODONE 5 MG immediate release tablet Commonly known as: Oxy IR/ROXICODONE Take 0.5-1 tablets (2.5-5 mg total) by mouth every 4 (four) hours as needed for moderate pain or severe pain.   potassium chloride SA 20 MEQ tablet Commonly known as: KLOR-CON Take 1 tablet (20 mEq total) by mouth daily.   tamsulosin 0.4 MG Caps capsule Commonly known as: FLOMAX Take 0.4 mg by mouth daily.   tiZANidine 4 MG tablet Commonly known as: ZANAFLEX Take 4 mg by mouth at bedtime.   torsemide 100 MG tablet Commonly known as: DEMADEX Take 1 tablet (100 mg total) by mouth daily. Start taking on: September 09, 2019   warfarin 5 MG tablet Commonly known as: COUMADIN Take as directed. If you are unsure how to take this medication, talk to your nurse or doctor. Original instructions: Take 0.5-1 tablets (2.5-5 mg total) by mouth daily at 6 PM. Take 1/2 tablet (2.5mg ) on Monday, Wednesdays, Fridays, then 1 tablet (5 mg) on Tuesdays, Thursdays, Saturday, Sunday. What changed: See the new instructions.            Durable  Medical Equipment  (From admission, onward)         Start     Ordered   09/05/19 1643  For home use only DME standard manual wheelchair with seat cushion  Once    Comments: Patient suffers from progressive chronic kidney disease stage V/debility which impairs their ability to perform daily activities in the home.  A walking aid will not  resolve issue with performing activities of daily living. A wheelchair will allow patient to safely perform daily activities. Patient can safely propel the wheelchair in the home or has a caregiver who can provide assistance. Length of need lifetime. Accessories: elevating leg rests (ELRs), wheel locks, extensions and anti-tippers.   09/05/19 1643         Allergies  Allergen Reactions  . Sulfa Antibiotics Other (See Comments)    From childhood- reaction??   Follow-up Information    Mayra Neer, MD. Schedule an appointment as soon as possible for a visit in 2 weeks.   Specialty: Family Medicine Why: f/u in 2-3 weeks;Office will call patient for appointment Contact information: [address] [place] [address] [phone]        Lorretta Harp, MD .   Specialties: Cardiology, Radiology Contact information: [address] [place] [address] [phone]        Madelon Lips, MD. Schedule an appointment as soon as possible for a visit in 1 week(s).   Specialty: Nephrology Contact information: [place] [address] [phone]        [facility] Follow up on 09/11/2019.   Specialty: Cardiology Why: coumadin clinic as scheduled on 09/11/2019 Contact information: [place] [phone]           The results of significant diagnostics from this hospitalization (including imaging, microbiology, ancillary and laboratory) are listed below for reference.    Significant Diagnostic Studies: CT CHEST WO  CONTRAST  Result Date: 09/02/2019 CLINICAL DATA:  86 year old male with chest pain and increased lower extremity edema. EXAM: CT CHEST WITHOUT CONTRAST TECHNIQUE: Multidetector CT imaging of the chest was performed following the standard protocol without IV contrast. COMPARISON:  Chest radiograph dated 09/01/2019. FINDINGS: Evaluation of this exam is limited in the absence of intravenous contrast. Cardiovascular: Top-normal cardiac size. No pericardial effusion. Advanced 3 vessel coronary vascular calcification. There is moderate atherosclerotic calcification of the thoracic aorta. The central pulmonary arteries are grossly unremarkable on this noncontrast CT. Mediastinum/Nodes: No hilar or mediastinal adenopathy. Evaluation however is limited in the absence of intravenous contrast and secondary to consolidative changes of the right lung. The esophagus is grossly unremarkable. No mediastinal fluid collection. Lungs/Pleura: There is a moderate right and small left pleural effusions. There is near complete consolidative changes of the right lower lobe which may represent atelectasis or infiltrate. Underlying mass is not excluded. Clinical correlation and follow-up to resolution recommended. Left lung base and lingular densities noted which are concerning for infiltrate. There is no pneumothorax. The central airways are patent. Upper Abdomen: Mild diffuse upper abdominal stranding and edema. Irregular liver contour may represent changes of cirrhosis. Cholecystectomy. Musculoskeletal: Osteopenia with degenerative changes of the spine and enthesopathy likely representing ankylosing spondylitis. Lower cervical ACDF. No acute osseous pathology. IMPRESSION: 1. Moderate right and small left pleural effusions. 2. Near complete consolidative changes of the right lower lobe as well as areas of airspace opacity in the left lower lobe and lingula may represent atelectasis or pneumonia. Clinical correlation and follow-up  recommended. 3. Severe 3 vessel coronary vascular calcification and Aortic Atherosclerosis (ICD10-I70.0). Electronically Signed   By: Anner Crete M.D.   On: 09/02/2019 22:14   US RENAL  Result Date: 09/03/2019 CLINICAL DATA:  Acute renal failure, history of chronic kidney disease, hypertension, pulmonary embolism, former smoker EXAM: RENAL / URINARY TRACT ULTRASOUND COMPLETE COMPARISON:  11/19/2017 FINDINGS: Right Kidney: Renal  measurements: 9.1 x 5.2 x 6.7 cm = volume: 167 mL. Marked cortical thinning. Increased cortical echogenicity. Small cyst at upper pole 19 x 22 x 19 mm. No additional mass or hydronephrosis. Left Kidney: Renal measurements: 9.7 x 5.8 x 5.7 cm = volume: 165 mL. Marked cortical thinning. Increased cortical echogenicity. 2 simple cysts are identified, at upper pole 25 x 21 x 15 mm and at mid kidney 10 x 12 x 14 mm. No additional masses or hydronephrosis. Bladder: Appears normal for degree of bladder distention. Other: N/A IMPRESSION: Marked cortical atrophy and medical renal disease changes of both kidneys. BILATERAL renal cysts. No evidence of solid renal mass or hydronephrosis. Electronically Signed   By: Lavonia Dana M.D.   On: 09/03/2019 12:02   DG Chest Portable 1 View  Result Date: 09/02/2019 CLINICAL DATA:  Shortness of breath EXAM: PORTABLE CHEST 1 VIEW COMPARISON:  October 22, 2018 FINDINGS: There is a pleural effusion on each side, larger on the right than the left. There is consolidation throughout much of the right middle and lower lobes. There is slight atelectasis in the left base. Heart is mildly enlarged with pulmonary vascularity normal. No adenopathy. There is postoperative change in the lower cervical spine. There is a radiopaque appearing structure in the right base region medially of uncertain etiology. IMPRESSION: Consolidation involving much of the right middle and lower lobes. Bilateral pleural effusions, larger on the right than the left. Heart mildly enlarged,  stable. No adenopathy. Radiopaque structure in the medial right base of uncertain etiology. This structure measures 4.4 x 2.6 cm. Its etiology is uncertain. Correlation with lateral chest radiograph potentially could be helpful for further assessment. Electronically Signed   By: Lowella Grip III M.D.   On: 09/02/2019 14:31   ECHOCARDIOGRAM COMPLETE  Result Date: 09/03/2019   ECHOCARDIOGRAM REPORT **  Patient Name:   Chase [name] Date of Exam: 09/03/2019 Medical Rec #:  [id number]      Height:       72.0 in Accession #:    [id number]     Weight:       234.7 lb Date of Birth:  Oct 04, 1932      BSA:          2.28 m Patient Age:    76 years       BP:           123/71 mmHg Patient Gender: M              HR:           63 bpm. Exam Location:  Inpatient Procedure: 2D Echo, Cardiac Doppler and Color Doppler Indications:    R94.31 Abnormal EKG  History:        Patient has prior history of Echocardiogram examinations, most                 recent 12/24/2018. Risk Factors:Hypertension, Diabetes and Sleep                 Apnea. Pulmonary Embolism. CKD.  Sonographer:    Jonelle Sidle Dance Referring Phys: [id number] BELKYS A REGALADO IMPRESSIONS  1. Left ventricular ejection fraction, by visual estimation, is 65 to 70%. The left ventricle has hyperdynamic function. There is moderately increased left ventricular hypertrophy.  2. Left ventricular diastolic parameters are consistent with Grade II diastolic dysfunction (pseudonormalization).  3. Global right ventricle has normal systolic function.The right ventricular size is normal. No increase in right ventricular wall thickness.  4. Left atrial  size was normal.  5. Right atrial size was normal.  6. Mild mitral annular calcification.  7. The mitral valve is normal in structure. Mild mitral valve regurgitation. No evidence of mitral stenosis.  8. The tricuspid valve is normal in structure.  9. The tricuspid valve is normal in structure. Tricuspid valve regurgitation is moderate. 10.  Aortic valve mean gradient measures 19.7 mmHg. 11. Aortic valve peak gradient measures 34.2 mmHg. 12. The aortic valve is normal in structure. Aortic valve regurgitation is mild. Moderate aortic valve stenosis. 13. There is severe calcifcation of the aortic valve. 14. There is severe thickening of the aortic valve. 15. The pulmonic valve was normal in structure. Pulmonic valve regurgitation is not visualized. 16. Normal pulmonary artery systolic pressure. 17. The inferior vena cava is normal in size with greater than 50% respiratory variability, suggesting right atrial pressure of 3 mmHg. FINDINGS  Left Ventricle: Left ventricular ejection fraction, by visual estimation, is 65 to 70%. The left ventricle has hyperdynamic function. The left ventricle is not well visualized. There is moderately increased left ventricular hypertrophy. Left ventricular  diastolic parameters are consistent with Grade II diastolic dysfunction (pseudonormalization). Normal left atrial pressure. Right Ventricle: The right ventricular size is normal. No increase in right ventricular wall thickness. Global RV systolic function is has normal systolic function. The tricuspid regurgitant velocity is 2.32 m/s, and with an assumed right atrial pressure  of 3 mmHg, the estimated right ventricular systolic pressure is normal at 24.5 mmHg. Left Atrium: Left atrial size was normal in size. Right Atrium: Right atrial size was normal in size Pericardium: There is no evidence of pericardial effusion. Mitral Valve: The mitral valve is normal in structure. Mild mitral annular calcification. Mild mitral valve regurgitation. No evidence of mitral valve stenosis by observation. Tricuspid Valve: The tricuspid valve is normal in structure. Tricuspid valve regurgitation is moderate. Aortic Valve: The aortic valve is normal in structure.. There is severe thickening and severe calcifcation of the aortic valve. Aortic valve regurgitation is mild. Moderate aortic  stenosis is present. There is severe thickening of the aortic valve. There  is severe calcifcation of the aortic valve. Aortic valve mean gradient measures 19.7 mmHg. Aortic valve peak gradient measures 34.2 mmHg. Aortic valve area, by VTI measures 1.32 cm. Pulmonic Valve: The pulmonic valve was normal in structure. Pulmonic valve regurgitation is not visualized. Pulmonic regurgitation is not visualized. Aorta: The aortic root, ascending aorta and aortic arch are all structurally normal, with no evidence of dilitation or obstruction. Venous: The inferior vena cava is normal in size with greater than 50% respiratory variability, suggesting right atrial pressure of 3 mmHg. IAS/Shunts: No atrial level shunt detected by color flow Doppler. There is no evidence of a patent foramen ovale. No ventricular septal defect is seen or detected. There is no evidence of an atrial septal defect.  LEFT VENTRICLE PLAX 2D LVIDd:         4.47 cm  Diastology LVIDs:         3.61 cm  LV e' lateral:   6.31 cm/s LV PW:         1.41 cm  LV E/e' lateral: 18.1 LV IVS:        1.76 cm  LV e' medial:    6.96 cm/s LVOT diam:     2.20 cm  LV E/e' medial:  16.4 LV SV:         36 ml LV SV Index:   15.38 LVOT Area:  3.80 cm  RIGHT VENTRICLE             IVC RV Basal diam:  2.89 cm     IVC diam: 1.74 cm RV S prime:     13.50 cm/s TAPSE (M-mode): 2.1 cm LEFT ATRIUM             Index       RIGHT ATRIUM           Index LA diam:        4.50 cm 1.97 cm/m  RA Area:     20.30 cm LA Vol (A2C):   94.0 ml 41.22 ml/m RA Volume:   62.00 ml  27.19 ml/m LA Vol (A4C):   39.7 ml 17.41 ml/m LA Biplane Vol: 62.4 ml 27.36 ml/m  AORTIC VALVE AV Area (Vmax):    1.23 cm AV Area (Vmean):   1.30 cm AV Area (VTI):     1.32 cm AV Vmax:           292.25 cm/s AV Vmean:          204.000 cm/s AV VTI:            0.775 m AV Peak Grad:      34.2 mmHg AV Mean Grad:      19.7 mmHg LVOT Vmax:         94.20 cm/s LVOT Vmean:        69.500 cm/s LVOT VTI:          0.268 m  LVOT/AV VTI ratio: 0.35  AORTA Ao Root diam: 3.60 cm Ao Asc diam:  3.80 cm MITRAL VALVE                         TRICUSPID VALVE MV Area (PHT): 3.17 cm              TR Peak grad:   21.5 mmHg MV PHT:        69.31 msec            TR Vmax:        251.00 cm/s MV Decel Time: 239 msec MV E velocity: 114.00 cm/s 103 cm/s  SHUNTS MV A velocity: 68.40 cm/s  70.3 cm/s Systemic VTI:  0.27 m MV E/A ratio:  1.67        1.5       Systemic Diam: 2.20 cm  Candee Furbish MD Electronically signed by Candee Furbish MD Signature Date/Time: 09/03/2019/1:17:30 PM    Final    DG HIP UNILAT WITH PELVIS 1V RIGHT  Result Date: 09/02/2019 CLINICAL DATA:  Pain and swelling EXAM: DG HIP (WITH OR WITHOUT PELVIS) 1V RIGHT COMPARISON:  10/13/2009, CT 02/12/2012 FINDINGS: SI joints are non widened. Extensive vascular calcifications. Pubic symphysis and rami are intact. Moderate severe arthritis left hip. Prior right hip replacement with intact hardware and normal alignment. IMPRESSION: Prior right hip replacement.  No acute osseous abnormality. Electronically Signed   By: Donavan Foil M.D.   On: 09/02/2019 20:19   DG HIPS BILAT WITH PELVIS 3-4 VIEWS  Result Date: 09/08/2019 CLINICAL DATA:  Bilateral hip pain after recent fall. EXAM: DG HIP (WITH OR WITHOUT PELVIS) 3-4V BILAT COMPARISON:  Pelvis and right hip x-rays dated September 02, 2019. FINDINGS: No acute fracture or dislocation. Prior right total hip arthroplasty. No evidence of hardware failure or loosening. Unchanged mild to moderate left hip joint space narrowing. Osteopenia. Extensive vascular calcifications again. IMPRESSION: 1.  No acute osseous  abnormality. 2. Prior right total hip arthroplasty without hardware complication. 3. Unchanged mild to moderate left hip osteoarthritis. Electronically Signed   By: Titus Dubin M.D.   On: 09/08/2019 11:16    Microbiology: Recent Results (from the past 240 hour(s))  SARS CORONAVIRUS 2 (TAT 6-24 HRS) Nasopharyngeal Nasopharyngeal Swab      Status: None   Collection Time: 09/02/19  4:34 PM   Specimen: Nasopharyngeal Swab  Result Value Ref Range Status   SARS Coronavirus 2 NEGATIVE NEGATIVE Final    Comment: (NOTE) SARS-CoV-2 target nucleic acids are NOT DETECTED. The SARS-CoV-2 RNA is generally detectable in upper and lower respiratory specimens during the acute phase of infection. Negative results do not preclude SARS-CoV-2 infection, do not rule out co-infections with other pathogens, and should not be used as the sole basis for treatment or other patient management decisions. Negative results must be combined with clinical observations, patient history, and epidemiological information. The expected result is Negative. Fact Sheet for Patients: SugarRoll.be Fact Sheet for Healthcare Providers: https://www.woods-mathews.com/ This test is not yet approved or cleared by the [place] FDA and  has been authorized for detection and/or diagnosis of SARS-CoV-2 by FDA under an Emergency Use Authorization (EUA). This EUA will remain  in effect (meaning this test can be used) for the duration of the COVID-19 declaration under Section 56 4(b)(1) of the Act, 21 U.S.C. section 360bbb-3(b)(1), unless the authorization is terminated or revoked sooner. Performed at [facility], [place] [address], [place], [place] [address]   Culture, Urine     Status: None   Collection Time: 09/03/19  9:26 AM   Specimen: Urine, Catheterized  Result Value Ref Range Status   Specimen Description URINE, CATHETERIZED  Final   Special Requests NONE  Final   Culture   Final    NO GROWTH Performed at [facility], [address] [place], [place] [address]    Report Status 09/04/2019 FINAL  Final     Labs: Basic Metabolic Panel: Recent Labs  Lab 09/04/19 0411 09/05/19 0540 09/06/19 0553 09/07/19 0519 09/08/19 0433  NA 148* 147* 148* 146* 148*  K 4.3 3.7 3.4* 3.5 3.2*  CL 116* 113* 112*  107 104  CO2 15* 19* 21* 25 28  GLUCOSE 119* 73 87 151* 154*  BUN 114* 109* 99* 92* 91*  CREATININE 5.84* 5.60* 5.41* 5.23* 5.13*  CALCIUM 7.8* 7.7* 8.3* 8.3* 8.1*  PHOS 9.8* 7.3* 5.9* 5.9* 5.1*   Liver Function Tests: Recent Labs  Lab 09/02/19 1351 09/02/19 1351 09/04/19 0411 09/05/19 0540 09/06/19 0553 09/07/19 0519 09/08/19 0433  AST 7*  --   --   --   --   --   --   ALT 11  --   --   --   --   --   --   ALKPHOS 65  --   --   --   --   --   --   BILITOT 0.7  --   --   --   --   --   --   PROT 6.4*  --   --   --   --   --   --   ALBUMIN 3.3*   < > 3.2* 2.9* 3.0* 2.7* 2.6*   < > = values in this interval not displayed.   No results for input(s): LIPASE, AMYLASE in the last 168 hours. No results for input(s): AMMONIA in the last 168 hours. CBC: Recent  Labs  Lab 09/02/19 1351 09/02/19 1351 09/03/19 0404 09/04/19 0411 09/06/19 0553 09/07/19 0519 09/08/19 0433  WBC 3.8*  --  3.4* 4.0 6.9 6.9  --   NEUTROABS 2.5  --   --  2.5  --   --   --   HGB 8.8*   < > 8.5* 8.3* 8.6* 9.0* 9.4*  HCT 27.7*   < > 26.4* 27.4* 27.4* 29.4* 30.7*  MCV 89.1  --  87.7 89.5 87.8 89.6  --   PLT 128*  --  113* 114* 125* 133*  --    < > = values in this interval not displayed.   Cardiac Enzymes: No results for input(s): CKTOTAL, CKMB, CKMBINDEX, TROPONINI in the last 168 hours. BNP: BNP (last 3 results) Recent Labs    09/02/19 1351  BNP 441.7*    ProBNP (last 3 results) No results for input(s): PROBNP in the last 8760 hours.  CBG: Recent Labs  Lab 09/07/19 1120 09/07/19 1626 09/07/19 2036 09/08/19 0625 09/08/19 1122  GLUCAP 184* 186* 231* 129* 212*       Signed:  Irine Seal MD.  [facility] 09/08/2019, 1:43 PM

## 2019-09-08 NOTE — Progress Notes (Signed)
ANTICOAGULATION CONSULT NOTE  Pharmacy Consult for warfarin Indication: history of VTE   Patient Measurements: Height: 6' (182.9 cm) Weight: 207 lb 11.2 oz (94.2 kg) IBW/kg (Calculated) : 77.6   Vital Signs: Temp: 98.7 F (37.1 C) (01/25 0457) Temp Source: Oral (01/25 0457) BP: 121/64 (01/25 0457) Pulse Rate: 97 (01/25 0457)  Labs: Recent Labs    09/06/19 0553 09/06/19 0553 09/07/19 0519 09/08/19 0433  HGB 8.6*   < > 9.0* 9.4*  HCT 27.4*  --  29.4* 30.7*  PLT 125*  --  133*  --   LABPROT 21.0*  --  22.6* 26.2*  INR 1.8*  --  2.0* 2.4*  CREATININE 5.41*  --  5.23* 5.13*   < > = values in this interval not displayed.    Assessment: 86 yo male admitted with SOB. Pt is on warfarin for hx of VTE. PTA dose is 5 mg daily per [facility] note on 12/3 (35 mg per week). INR on admit was 6.3. Vitamin K sq 2 mg on 1/20 and 1/21 and warfarin held. Last dose of warfarin 1/18. Renal function is poor but relatively stable with serum creatinine between 5-6. Patient noted not to be eating much of his meals.   INR today is therapeutic at 2.4. His H&H is stable at 9.4/30.7, plts are low but increasing at 133. No bleeding noted.    Goal of Therapy:  INR 2-3 Monitor platelets by anticoagulation protocol: Yes   Plan:  Give warfarin 2.5 mg PO x 1 today Daily INR, s/s bleeding Monitor for signs of bleeding    Thank you,   Sherren Kerns, PharmD PGY1 Acute Care Pharmacy Resident  Please check amion for clinical pharmacist contact number

## 2019-09-08 NOTE — TOC Progression Note (Signed)
Transition of Care [facility]) - Progression Note    Patient Details  Name: Chase [name] MRN: [id number] Date of Birth: Nov 11, 1932  [facility] CM/SW Contact  Zenon Mayo, RN Phone Number: 09/08/2019, 12:35 PM  Clinical Narrative:    DME has been delivered to the home per son. Son states they will transport patient by car when ready for dc.        Expected Discharge Plan and Services                                                 Social Determinants of Health (SDOH) Interventions    Readmission Risk Interventions No flowsheet data found.

## 2019-09-08 NOTE — Progress Notes (Signed)
I responded to spiritual care consult for support for Chase [name]. He was siting up in his bed. I offered him space to offer concerns and questions. Chase [name] said that he feels ok right now. He did say that occasionally some days are better than others. He mentioned how his life was great and that he has no regrets. He says that he is looking forward to going home to his family.  He said he has been a believer all his life, that his mom would drag him to Sunday school in his childhood years. He talked about how he always build things with his hands including his home. He says that he knows he cannot built things because of his health, but would still like to do things. He said he was ready when ever the time comes. He says his support sytem includes his two adult kids, and ten grand children. He was thankful for the Spiritual care visit. Chaplain available as needed.   Chaplain Resident Fidel Levy MA [phone]

## 2019-09-08 NOTE — Progress Notes (Signed)
Physical Therapy Treatment Patient Details Name: Chase [name] MRN: [id number] DOB: 1932-09-02 Today's Date: 09/08/2019    History of Present Illness 86 y.o. male past medical history significant for chronic kidney disease stage IV,  prior cr 2.0 2013, diabetes, peripheral vascular disease, history of PE, who presents complaining of worsening shortness of breath.    PT Comments    Patient progressing well towards PT goals. Improved ambulation distance with close min guard for balance/safety and use of RW. Fatigues quickly. Continues to require assist for standing due to BLE weakness. BLEs are highly sensitive to touch; reports burning sensation as well. HR up to 121 bpm during activity. Eager to return home. Will follow.   Follow Up Recommendations  Home health PT;Supervision/Assistance - 24 hour     Equipment Recommendations  Wheelchair (measurements PT);Wheelchair cushion (measurements PT)    Recommendations for Other Services       Precautions / Restrictions Precautions Precautions: Fall Restrictions Weight Bearing Restrictions: No Other Position/Activity Restrictions: BLE painful to the touch, "burning" pain    Mobility  Bed Mobility Overal bed mobility: Needs Assistance Bed Mobility: Supine to Sit     Supine to sit: Min assist;HOB elevated Sit to supine: Min guard;HOB elevated   General bed mobility comments: Able to bring LEs to EOB, light assist with trunk. Increased time for pt to scoot to EOB. Able to bring LEs into bed to return to supine.  Transfers Overall transfer level: Needs assistance Equipment used: Rolling walker (2 wheeled) Transfers: Sit to/from Stand Sit to Stand: Min assist;From elevated surface         General transfer comment: ASsist to power to standing with multiple attempts. Stood from [employer], from chair x1.  Ambulation/Gait Ambulation/Gait assistance: Min guard Gait Distance (Feet): 75 Feet Assistive device: Rolling walker (2  wheeled) Gait Pattern/deviations: Shuffle;Narrow base of support;Step-through pattern;Step-to pattern;Trunk flexed Gait velocity: decreased Gait velocity interpretation: <1.31 ft/sec, indicative of household ambulator General Gait Details: Slow, shuffling like gait with decreased foot clearance bilateraly. Chair follow helpful. HR 121 bpm.   Stairs             Wheelchair Mobility    Modified Rankin (Stroke Patients Only)       Balance Overall balance assessment: Needs assistance Sitting-balance support: Feet supported;Bilateral upper extremity supported Sitting balance-Leahy Scale: Fair     Standing balance support: During functional activity Standing balance-Leahy Scale: Poor Standing balance comment: Requires UE support in standing.                            Cognition Arousal/Alertness: Awake/alert Behavior During Therapy: WFL for tasks assessed/performed Overall Cognitive Status: No family/caregiver present to determine baseline cognitive functioning                                 General Comments: Appears WFL however pt talking about falling out of a wooden chair while in the hospital (it is known that back of pt's recliner broke when sitting in it, however pt declines it was the recliner present in room which is obviously broken). Tangential in speech.      Exercises      General Comments        Pertinent Vitals/Pain Pain Assessment: Faces Faces Pain Scale: Hurts whole lot Pain Location: BLE, back, hip Pain Descriptors / Indicators: Burning;Sore;Discomfort Pain Intervention(s): Repositioned;Monitored during session    Home  Living                      Prior Function            PT Goals (current goals can now be found in the care plan section) Progress towards PT goals: Progressing toward goals    Frequency    Min 3X/week      PT Plan Current plan remains appropriate    Co-evaluation PT/OT/SLP  Co-Evaluation/Treatment: Yes Reason for Co-Treatment: For patient/therapist safety PT goals addressed during session: Mobility/safety with mobility        AM-PAC PT "6 Clicks" Mobility   Outcome Measure  Help needed turning from your back to your side while in a flat bed without using bedrails?: A Little Help needed moving from lying on your back to sitting on the side of a flat bed without using bedrails?: A Little Help needed moving to and from a bed to a chair (including a wheelchair)?: A Little Help needed standing up from a chair using your arms (e.g., wheelchair or bedside chair)?: A Little Help needed to walk in hospital room?: A Little Help needed climbing 3-5 steps with a railing? : A Lot 6 Click Score: 17    End of Session Equipment Utilized During Treatment: Gait belt Activity Tolerance: Patient tolerated treatment well Patient left: in bed;with call bell/phone within reach;with bed alarm set Nurse Communication: Mobility status PT Visit Diagnosis: Difficulty in walking, not elsewhere classified (R26.2);Unsteadiness on feet (R26.81);Pain Pain - Right/Left: (bil) Pain - part of body: Knee     Time: 8110-3159 PT Time Calculation (min) (ACUTE ONLY): 35 min  Charges:  $Gait Training: 8-22 mins                     Marisa Severin, PT, DPT Acute Rehabilitation Services Pager [phone] Office [place] 09/08/2019, 1:57 PM

## 2019-09-08 NOTE — Progress Notes (Signed)
Palliative Medicine Inpatient Follow Up Note   HPI: Per intake H&P --> Chase Metzgar Jonesis a 86 y.o.malepast medical history significant for chronic kidney disease stageIV, prior cr 2.0 2013, diabetes, peripheral vascular disease, history of PE,who presented complaining of worsening shortness of breath. Has been on IV abx to treat CAP. Receiving diuresis to aid in volume overload.   Patient has significant CKD5, appears to have made some gradual improvements with diuresis though his underlying disease it quite pronounced.   Palliative Care was asked to consult to further address goals of care.  Today's Discussion (09/08/2019): Chart reviewed. Met with patient at bedside. Family has identified that he will transition home with hospice. Spoke to [place] patients eldest son this morning. He stated that he is anxious to get dad home and they are "ready" for him. The only thing pending it appears is hospital bed delivery.   Discussed with patient the importance of continued conversation with family and their  medical providers regarding overall plan of care and treatment options, ensuring decisions are within the context of the patients values and GOCs.  Vital Signs Vitals:   09/07/19 2034 09/08/19 0457  BP: 134/63 121/64  Pulse: 95 97  Resp: 17 18  Temp: 98.8 F (37.1 C) 98.7 F (37.1 C)  SpO2: 96% 95%    Intake/Output Summary (Last 24 hours) at 09/08/2019 1007 Last data filed at 09/08/2019 0500 Gross per 24 hour  Intake 220 ml  Output 1800 ml  Net -1580 ml   Last Weight  Most recent update: 09/08/2019  5:00 AM   Weight  94.2 kg (207 lb 11.2 oz)           Physical Exam Vitals and nursing note reviewed.  HENT:     Head: Normocephalic.     Mouth/Throat:     Pharynx: Oropharynx is clear.  Eyes:     Pupils: Pupils are equal, round, and reactive to light.  Cardiovascular:     Rate and Rhythm: Normal rate and regular rhythm.  Pulmonary:     Effort: Pulmonary effort is  normal.     Comments: Crackles bilaterally Abdominal:     Palpations: Abdomen is soft.  Musculoskeletal:     Cervical back: Normal range of motion.     Comments: Weak  Skin:    General: Skin is warm and dry.     Capillary Refill: Capillary refill takes less than 2 seconds.  Neurological:     Mental Status: He is alert and oriented to person, place, and time.  Psychiatric:        Mood and Affect: Mood normal.   SUMMARY OF RECOMMENDATIONS   DNR/DNI No Dialysis Chaplain to offer support  Plan to transition home on hospice once final DME is delivered  Code Status/Advance Care Planning:  DNR   No hemodialysis  Symptom Management:  Goals of Care: - As above   Generalized Pain:                 - Tylenol 693m Po TID                 - Oxycodone 2.5-569mPO Q4H PRN                 - Agree with voltaren gel                 - Would use zanaflex with caution as it can increase likelihood of delirium in our geriatric population  Dyspnea:                 -  Supplemental O2                 - Diuresis per primary                 - If required could consider low dose IV opoid, would likely opt to utilize fentanyl IV  Given kidney dysfuntion  Muscular Weakness: - OOB to chair as often as able  Delirium: - Delirium precautions - Get up during the day - Encourage a familiar face to remain present throughout the day - Keep blinds open and lights on during daylight hours - Minimize the use of opioids/benzodiazepines  Spiritual Support: - Chaplain consult  Time Spent: 15 Greater than 50% of the time was spent in counseling and coordination of care ______________________________________________________________________________________ [place] Team Team Cell Phone: [phone] Please utilize secure chat  with additional questions, if there is no response within 30 minutes please call the above phone number  Palliative Medicine Team providers are available by phone from 7am to 7pm daily and can be reached through the team cell phone.  Should this patient require assistance outside of these hours, please call the patient's attending physician.

## 2019-09-08 NOTE — Progress Notes (Signed)
[name] ROUNDING NOTE   Subjective:   This is an 86 year old gentleman with a history of chronic kidney disease stage V followed by Dr. Hollie Salk at [facility].  He was admitted 09/03/2019 with worsening dyspnea he has a history of chronic venous stasis diastolic heart failure and history of pulmonary embolus in the past he also history of diabetes mellitus type 2.  Imaging of his chest revealed bilateral pleural effusions and was admitted for intravenous diuresis.  His baseline serum creatinine appears to be about 4.75 mg deciliter 07/21/2019.  He is expressed very clearly that he does not wish to pursue dialysis.  He continues on diuresis and was transitioned to torsemide 100 mg  daily 09/06/2018.  Urine output 2 L 09/06/2018  Blood pressure 121/64 pulse 86 temperature 98.7 O2 sats 95% room air  Sodium 148 potassium 3.2 chloride 104 CO2 28 BUN 91 creatinine 5.13 glucose 154 calcium 8.1 phosphorus 5.1 albumin 2.6 hemoglobin 9.4 WBC 6.9 platelets 133 INR 2.4  Calcitriol 0.25 mcg q. other day cefdinir 300 mg every 24 hours, potassium chloride 40 mEq daily, torsemide 0.4 mg daily, Coumadin 2.5 mg daily  Objective:  Vital signs in last 24 hours:  Temp:  [98.1 F (36.7 C)-98.8 F (37.1 C)] 98.7 F (37.1 C) (01/25 0457) Pulse Rate:  [92-97] 97 (01/25 0457) Resp:  [16-18] 18 (01/25 0457) BP: (121-138)/(63-68) 121/64 (01/25 0457) SpO2:  [95 %-96 %] 95 % (01/25 0457) Weight:  [94.2 kg] 94.2 kg (01/25 0500)  Weight change: -0.816 kg Filed Weights   09/06/19 0341 09/07/19 0608 09/08/19 0500  Weight: 98.9 kg 95 kg 94.2 kg    Intake/Output: I/O last 3 completed shifts: In: 780 [P.O.:780] Out: 2975 [Urine:2975]   Intake/Output this shift:  No intake/output data recorded.  Frail elderly male nondistressed CVS-regular rate and rhythm systolic murmur aortic region RS- CTA no wheezes no rales diminished at bases ABD- BS present soft non-distended EXT- no edema  pigmented nontender lower extremities   Basic Metabolic Panel: Recent Labs  Lab 09/04/19 0411 09/04/19 0411 09/05/19 0540 09/05/19 0540 09/06/19 0553 09/07/19 0519 09/08/19 0433  NA 148*  --  147*  --  148* 146* 148*  K 4.3  --  3.7  --  3.4* 3.5 3.2*  CL 116*  --  113*  --  112* 107 104  CO2 15*  --  19*  --  21* 25 28  GLUCOSE 119*  --  73  --  87 151* 154*  BUN 114*  --  109*  --  99* 92* 91*  CREATININE 5.84*  --  5.60*  --  5.41* 5.23* 5.13*  CALCIUM 7.8*   < > 7.7*   < > 8.3* 8.3* 8.1*  PHOS 9.8*  --  7.3*  --  5.9* 5.9* 5.1*   < > = values in this interval not displayed.    Liver Function Tests: Recent Labs  Lab 09/02/19 1351 09/02/19 1351 09/04/19 0411 09/05/19 0540 09/06/19 0553 09/07/19 0519 09/08/19 0433  AST 7*  --   --   --   --   --   --   ALT 11  --   --   --   --   --   --   ALKPHOS 65  --   --   --   --   --   --   BILITOT 0.7  --   --   --   --   --   --  PROT 6.4*  --   --   --   --   --   --   ALBUMIN 3.3*   < > 3.2* 2.9* 3.0* 2.7* 2.6*   < > = values in this interval not displayed.   No results for input(s): LIPASE, AMYLASE in the last 168 hours. No results for input(s): AMMONIA in the last 168 hours.  CBC: Recent Labs  Lab 09/02/19 1351 09/02/19 1351 09/03/19 0404 09/04/19 0411 09/06/19 0553 09/07/19 0519 09/08/19 0433  WBC 3.8*  --  3.4* 4.0 6.9 6.9  --   NEUTROABS 2.5  --   --  2.5  --   --   --   HGB 8.8*   < > 8.5* 8.3* 8.6* 9.0* 9.4*  HCT 27.7*   < > 26.4* 27.4* 27.4* 29.4* 30.7*  MCV 89.1  --  87.7 89.5 87.8 89.6  --   PLT 128*  --  113* 114* 125* 133*  --    < > = values in this interval not displayed.    Cardiac Enzymes: No results for input(s): CKTOTAL, CKMB, CKMBINDEX, TROPONINI in the last 168 hours.  BNP: Invalid input(s): POCBNP  CBG: Recent Labs  Lab 09/07/19 0638 09/07/19 1120 09/07/19 1626 09/07/19 2036 09/08/19 0625  GLUCAP 129* 184* 186* 231* 129*    Microbiology: Results for orders placed or  performed during the hospital encounter of 09/02/19  SARS CORONAVIRUS 2 (TAT 6-24 HRS) Nasopharyngeal Nasopharyngeal Swab     Status: None   Collection Time: 09/02/19  4:34 PM   Specimen: Nasopharyngeal Swab  Result Value Ref Range Status   SARS Coronavirus 2 NEGATIVE NEGATIVE Final    Comment: (NOTE) SARS-CoV-2 target nucleic acids are NOT DETECTED. The SARS-CoV-2 RNA is generally detectable in upper and lower respiratory specimens during the acute phase of infection. Negative results do not preclude SARS-CoV-2 infection, do not rule out co-infections with other pathogens, and should not be used as the sole basis for treatment or other patient management decisions. Negative results must be combined with clinical observations, patient history, and epidemiological information. The expected result is Negative. Fact Sheet for Patients: SugarRoll.be Fact Sheet for Healthcare Providers: https://www.woods-mathews.com/ This test is not yet approved or cleared by the [place] FDA and  has been authorized for detection and/or diagnosis of SARS-CoV-2 by FDA under an Emergency Use Authorization (EUA). This EUA will remain  in effect (meaning this test can be used) for the duration of the COVID-19 declaration under Section 56 4(b)(1) of the Act, 21 U.S.C. section 360bbb-3(b)(1), unless the authorization is terminated or revoked sooner. Performed at [facility], [place] [address] [place], [place] [address]   Culture, Urine     Status: None   Collection Time: 09/03/19  9:26 AM   Specimen: Urine, Catheterized  Result Value Ref Range Status   Specimen Description URINE, CATHETERIZED  Final   Special Requests NONE  Final   Culture   Final    NO GROWTH Performed at [facility], [address], [place], [place] [address]    Report Status 09/04/2019 FINAL  Final    Coagulation Studies: Recent Labs    09/06/19 0553 09/07/19 0519  09/08/19 0433  LABPROT 21.0* 22.6* 26.2*  INR 1.8* 2.0* 2.4*    Urinalysis: No results for input(s): COLORURINE, LABSPEC, PHURINE, GLUCOSEU, HGBUR, BILIRUBINUR, KETONESUR, PROTEINUR, UROBILINOGEN, NITRITE, LEUKOCYTESUR in the last 72 hours.  Invalid input(s): APPERANCEUR    Imaging: No results found.  Medications:   . sodium chloride 250 mL (09/03/19 1056)   . acetaminophen  650 mg Oral TID  . calcitRIOL  0.25 mcg Oral QODAY  . cefdinir  300 mg Oral Q24H  . insulin aspart  0-6 Units Subcutaneous TID WC  . insulin glargine  10 Units Subcutaneous Daily  . potassium chloride  40 mEq Oral Once  . tamsulosin  0.4 mg Oral QPC supper  . tiZANidine  4 mg Oral QHS  . torsemide  100 mg Oral Daily  . warfarin  2.5 mg Oral ONCE-1800  . Warfarin - Pharmacist Dosing Inpatient   Does not apply q1800   sodium chloride, diclofenac Sodium, nitroGLYCERIN, ondansetron **OR** ondansetron (ZOFRAN) IV, oxyCODONE  Assessment/ Plan:  1. Progressive CKD5, follows at [place] with Hollie Salk will sign off patient today he can follow-up with [facility]. 2. dCHF exacerbation / hypervolemia / pleural effusion / pulmonary edema; improving.  Diuresing well with torsemide 100 mg daily we will continue.  This is a change from an outpatient Lasix order of 40 to 60 mg daily 3. Possible community-acquired pneumonia continues on Cefnidir this can be discontinued. 4. AFib on warfarin 5. Hx/o PE 6. Chronic venous statsis 7. DM2 8. Anemia with features of IDA and due to #1, status post ESA and IV iron 1/20 9. Metabolic Acidosis on NaHCo3, improving 10. Tupratherapeutic INR per TRH, improving 11. Hypokalemia patient given oral potassium chloride supplementation would continue oral potassium supplementation and recommend 20 mEq of potassium chloride daily with weekly renal panel.   LOS: [place] @TODAY @8 :38 AM

## 2019-09-08 NOTE — Progress Notes (Signed)
Occupational Therapy Treatment Patient Details Name: Chase [name] MRN: [id number] DOB: 05/30/1933 Today's Date: 09/08/2019    History of present illness 86 y.o. male past medical history significant for chronic kidney disease stage IV,  prior cr 2.0 2013, diabetes, peripheral vascular disease, history of PE, who presents complaining of worsening shortness of breath.   OT comments  Pt progressing to OOB ADL and mobility. Pt sat at sink for grooming tasks with set-upA. Pt minA overall for transfers and mobility with RW. Pt with B/L pain at all times. Pt with questionable cognition today regarding a previous therapy session. Pt would benefit from continued OT skilled services in home health setting. OT following acutely.    Follow Up Recommendations  Home health OT;Supervision/Assistance - 24 hour(not required to have OT, only if pt wishes to continue.)    Equipment Recommendations  None recommended by OT    Recommendations for Other Services      Precautions / Restrictions Precautions Precautions: Fall Restrictions Weight Bearing Restrictions: No Other Position/Activity Restrictions: BLE painful to the touch, "burning" pain       Mobility Bed Mobility Overal bed mobility: Needs Assistance Bed Mobility: Supine to Sit     Supine to sit: Min assist;HOB elevated Sit to supine: Min guard;HOB elevated   General bed mobility comments: Increased time for scooting to EOB and bring BLEs to EOB; use of rail and elevated HOB for sitting upright  Transfers Overall transfer level: Needs assistance Equipment used: Rolling walker (2 wheeled) Transfers: Sit to/from Stand Sit to Stand: Min assist;From elevated surface         General transfer comment: Pt use of RW for stability    Balance Overall balance assessment: Needs assistance Sitting-balance support: Feet supported;Bilateral upper extremity supported Sitting balance-Leahy Scale: Fair     Standing balance support: During  functional activity Standing balance-Leahy Scale: Poor Standing balance comment: Requires UE support in standing.                           ADL either performed or assessed with clinical judgement   ADL Overall ADL's : Needs assistance/impaired     Grooming: Set up;Sitting Grooming Details (indicate cue type and reason): washed hands, combed hair             Lower Body Dressing: Maximal assistance;Sitting/lateral leans;Sit to/from stand Lower Body Dressing Details (indicate cue type and reason): donning shoes requires assist due to soreness of feet/BLEs             Functional mobility during ADLs: Minimal assistance;Rolling walker;Cueing for safety General ADL Comments: Pt limited by BLE pain, decreased ability to care for self and decreased activity tolerance.     Vision       Perception     Praxis      Cognition Arousal/Alertness: Awake/alert Behavior During Therapy: WFL for tasks assessed/performed Overall Cognitive Status: No family/caregiver present to determine baseline cognitive functioning                                 General Comments: Appears to be exaggerating a story of falling out of chair in hospital room as there is no mention by RN or in chart of pt falling  out. Pt's recliner was slightly broken at trunk reclining portion per chart, but pt did not fall.        Exercises  Shoulder Instructions       General Comments VSS on RA    Pertinent Vitals/ Pain       Pain Assessment: Faces Faces Pain Scale: Hurts whole lot Pain Location: BLE, back, hip Pain Descriptors / Indicators: Burning;Sore;Discomfort Pain Intervention(s): Repositioned  Home Living                                          Prior Functioning/Environment              Frequency  Min 2X/week        Progress Toward Goals  OT Goals(current goals can now be found in the care plan section)  Progress towards OT goals:  Progressing toward goals  Acute Rehab OT Goals Patient Stated Goal: reduce pain, move better OT Goal Formulation: With patient Time For Goal Achievement: 09/19/19 Potential to Achieve Goals: Good ADL Goals Pt Will Perform Lower Body Bathing: with modified independence;with supervision;sit to/from stand Pt Will Perform Lower Body Dressing: with modified independence;with supervision;sit to/from stand Pt Will Transfer to Toilet: with supervision;ambulating Pt Will Perform Toileting - Clothing Manipulation and hygiene: with modified independence;sit to/from stand Pt Will Perform Tub/Shower Transfer: with min guard assist;with supervision;ambulating;rolling walker;shower seat  Plan Discharge plan remains appropriate    Co-evaluation    PT/OT/SLP Co-Evaluation/Treatment: Yes Reason for Co-Treatment: For patient/therapist safety;To address functional/ADL transfers PT goals addressed during session: Mobility/safety with mobility        AM-PAC OT "6 Clicks" Daily Activity     Outcome Measure   Help from another person eating meals?: None Help from another person taking care of personal grooming?: None Help from another person toileting, which includes using toliet, bedpan, or urinal?: A Lot Help from another person bathing (including washing, rinsing, drying)?: A Lot Help from another person to put on and taking off regular upper body clothing?: A Little Help from another person to put on and taking off regular lower body clothing?: A Lot 6 Click Score: 17    End of Session Equipment Utilized During Treatment: Rolling walker  OT Visit Diagnosis: Unsteadiness on feet (R26.81);Pain;Muscle weakness (generalized) (M62.81);Other abnormalities of gait and mobility (R26.89) Pain - part of body: (legs)   Activity Tolerance Patient tolerated treatment well   Patient Left in bed;with call bell/phone within reach;with bed alarm set   Nurse Communication Mobility status        Time:  7353-2992 OT Time Calculation (min): 30 min  Charges: OT General Charges $OT Visit: 1 Visit OT Treatments $Self Care/Home Management : 8-22 mins  Jefferey Pica OTR/L Acute Rehabilitation Services Pager: [phone] Office: [phone]    Rolande Moe C 09/08/2019, 4:35 PM

## 2019-09-09 DIAGNOSIS — E1151 Type 2 diabetes mellitus with diabetic peripheral angiopathy without gangrene: Secondary | ICD-10-CM | POA: Diagnosis not present

## 2019-09-09 DIAGNOSIS — J9610 Chronic respiratory failure, unspecified whether with hypoxia or hypercapnia: Secondary | ICD-10-CM | POA: Diagnosis not present

## 2019-09-09 DIAGNOSIS — E1122 Type 2 diabetes mellitus with diabetic chronic kidney disease: Secondary | ICD-10-CM | POA: Diagnosis not present

## 2019-09-09 DIAGNOSIS — M109 Gout, unspecified: Secondary | ICD-10-CM | POA: Diagnosis not present

## 2019-09-09 DIAGNOSIS — E785 Hyperlipidemia, unspecified: Secondary | ICD-10-CM | POA: Diagnosis not present

## 2019-09-09 DIAGNOSIS — Z794 Long term (current) use of insulin: Secondary | ICD-10-CM | POA: Diagnosis not present

## 2019-09-09 DIAGNOSIS — D631 Anemia in chronic kidney disease: Secondary | ICD-10-CM | POA: Diagnosis not present

## 2019-09-09 DIAGNOSIS — N400 Enlarged prostate without lower urinary tract symptoms: Secondary | ICD-10-CM | POA: Diagnosis not present

## 2019-09-09 DIAGNOSIS — Z7901 Long term (current) use of anticoagulants: Secondary | ICD-10-CM | POA: Diagnosis not present

## 2019-09-09 DIAGNOSIS — I132 Hypertensive heart and chronic kidney disease with heart failure and with stage 5 chronic kidney disease, or end stage renal disease: Secondary | ICD-10-CM | POA: Diagnosis not present

## 2019-09-09 DIAGNOSIS — N186 End stage renal disease: Secondary | ICD-10-CM | POA: Diagnosis not present

## 2019-09-09 DIAGNOSIS — Z741 Need for assistance with personal care: Secondary | ICD-10-CM | POA: Diagnosis not present

## 2019-09-09 DIAGNOSIS — I5030 Unspecified diastolic (congestive) heart failure: Secondary | ICD-10-CM | POA: Diagnosis not present

## 2019-09-09 DIAGNOSIS — I872 Venous insufficiency (chronic) (peripheral): Secondary | ICD-10-CM | POA: Diagnosis not present

## 2019-09-09 DIAGNOSIS — J189 Pneumonia, unspecified organism: Secondary | ICD-10-CM | POA: Diagnosis not present

## 2019-09-09 DIAGNOSIS — I350 Nonrheumatic aortic (valve) stenosis: Secondary | ICD-10-CM | POA: Diagnosis not present

## 2019-09-09 DIAGNOSIS — Z86711 Personal history of pulmonary embolism: Secondary | ICD-10-CM | POA: Diagnosis not present

## 2019-09-09 DIAGNOSIS — D696 Thrombocytopenia, unspecified: Secondary | ICD-10-CM | POA: Diagnosis not present

## 2019-09-10 DIAGNOSIS — I132 Hypertensive heart and chronic kidney disease with heart failure and with stage 5 chronic kidney disease, or end stage renal disease: Secondary | ICD-10-CM | POA: Diagnosis not present

## 2019-09-10 DIAGNOSIS — E1122 Type 2 diabetes mellitus with diabetic chronic kidney disease: Secondary | ICD-10-CM | POA: Diagnosis not present

## 2019-09-10 DIAGNOSIS — E785 Hyperlipidemia, unspecified: Secondary | ICD-10-CM | POA: Diagnosis not present

## 2019-09-10 DIAGNOSIS — N186 End stage renal disease: Secondary | ICD-10-CM | POA: Diagnosis not present

## 2019-09-10 DIAGNOSIS — I5030 Unspecified diastolic (congestive) heart failure: Secondary | ICD-10-CM | POA: Diagnosis not present

## 2019-09-10 DIAGNOSIS — D631 Anemia in chronic kidney disease: Secondary | ICD-10-CM | POA: Diagnosis not present

## 2019-09-11 DIAGNOSIS — I5030 Unspecified diastolic (congestive) heart failure: Secondary | ICD-10-CM | POA: Diagnosis not present

## 2019-09-11 DIAGNOSIS — E1122 Type 2 diabetes mellitus with diabetic chronic kidney disease: Secondary | ICD-10-CM | POA: Diagnosis not present

## 2019-09-11 DIAGNOSIS — E785 Hyperlipidemia, unspecified: Secondary | ICD-10-CM | POA: Diagnosis not present

## 2019-09-11 DIAGNOSIS — N186 End stage renal disease: Secondary | ICD-10-CM | POA: Diagnosis not present

## 2019-09-11 DIAGNOSIS — D631 Anemia in chronic kidney disease: Secondary | ICD-10-CM | POA: Diagnosis not present

## 2019-09-11 DIAGNOSIS — I132 Hypertensive heart and chronic kidney disease with heart failure and with stage 5 chronic kidney disease, or end stage renal disease: Secondary | ICD-10-CM | POA: Diagnosis not present

## 2019-09-12 DIAGNOSIS — E785 Hyperlipidemia, unspecified: Secondary | ICD-10-CM | POA: Diagnosis not present

## 2019-09-12 DIAGNOSIS — E1122 Type 2 diabetes mellitus with diabetic chronic kidney disease: Secondary | ICD-10-CM | POA: Diagnosis not present

## 2019-09-12 DIAGNOSIS — N186 End stage renal disease: Secondary | ICD-10-CM | POA: Diagnosis not present

## 2019-09-12 DIAGNOSIS — I5030 Unspecified diastolic (congestive) heart failure: Secondary | ICD-10-CM | POA: Diagnosis not present

## 2019-09-12 DIAGNOSIS — I132 Hypertensive heart and chronic kidney disease with heart failure and with stage 5 chronic kidney disease, or end stage renal disease: Secondary | ICD-10-CM | POA: Diagnosis not present

## 2019-09-12 DIAGNOSIS — D631 Anemia in chronic kidney disease: Secondary | ICD-10-CM | POA: Diagnosis not present

## 2019-09-15 DIAGNOSIS — M109 Gout, unspecified: Secondary | ICD-10-CM | POA: Diagnosis not present

## 2019-09-15 DIAGNOSIS — E1151 Type 2 diabetes mellitus with diabetic peripheral angiopathy without gangrene: Secondary | ICD-10-CM | POA: Diagnosis not present

## 2019-09-15 DIAGNOSIS — I132 Hypertensive heart and chronic kidney disease with heart failure and with stage 5 chronic kidney disease, or end stage renal disease: Secondary | ICD-10-CM | POA: Diagnosis not present

## 2019-09-15 DIAGNOSIS — J189 Pneumonia, unspecified organism: Secondary | ICD-10-CM | POA: Diagnosis not present

## 2019-09-15 DIAGNOSIS — E785 Hyperlipidemia, unspecified: Secondary | ICD-10-CM | POA: Diagnosis not present

## 2019-09-15 DIAGNOSIS — E1122 Type 2 diabetes mellitus with diabetic chronic kidney disease: Secondary | ICD-10-CM | POA: Diagnosis not present

## 2019-09-15 DIAGNOSIS — I5030 Unspecified diastolic (congestive) heart failure: Secondary | ICD-10-CM | POA: Diagnosis not present

## 2019-09-15 DIAGNOSIS — Z86711 Personal history of pulmonary embolism: Secondary | ICD-10-CM | POA: Diagnosis not present

## 2019-09-15 DIAGNOSIS — I350 Nonrheumatic aortic (valve) stenosis: Secondary | ICD-10-CM | POA: Diagnosis not present

## 2019-09-15 DIAGNOSIS — I872 Venous insufficiency (chronic) (peripheral): Secondary | ICD-10-CM | POA: Diagnosis not present

## 2019-09-15 DIAGNOSIS — J9610 Chronic respiratory failure, unspecified whether with hypoxia or hypercapnia: Secondary | ICD-10-CM | POA: Diagnosis not present

## 2019-09-15 DIAGNOSIS — Z741 Need for assistance with personal care: Secondary | ICD-10-CM | POA: Diagnosis not present

## 2019-09-15 DIAGNOSIS — Z794 Long term (current) use of insulin: Secondary | ICD-10-CM | POA: Diagnosis not present

## 2019-09-15 DIAGNOSIS — D631 Anemia in chronic kidney disease: Secondary | ICD-10-CM | POA: Diagnosis not present

## 2019-09-15 DIAGNOSIS — N186 End stage renal disease: Secondary | ICD-10-CM | POA: Diagnosis not present

## 2019-09-15 DIAGNOSIS — Z7901 Long term (current) use of anticoagulants: Secondary | ICD-10-CM | POA: Diagnosis not present

## 2019-09-15 DIAGNOSIS — D696 Thrombocytopenia, unspecified: Secondary | ICD-10-CM | POA: Diagnosis not present

## 2019-09-15 DIAGNOSIS — N400 Enlarged prostate without lower urinary tract symptoms: Secondary | ICD-10-CM | POA: Diagnosis not present

## 2019-09-16 DIAGNOSIS — D631 Anemia in chronic kidney disease: Secondary | ICD-10-CM | POA: Diagnosis not present

## 2019-09-16 DIAGNOSIS — E785 Hyperlipidemia, unspecified: Secondary | ICD-10-CM | POA: Diagnosis not present

## 2019-09-16 DIAGNOSIS — N186 End stage renal disease: Secondary | ICD-10-CM | POA: Diagnosis not present

## 2019-09-16 DIAGNOSIS — I5030 Unspecified diastolic (congestive) heart failure: Secondary | ICD-10-CM | POA: Diagnosis not present

## 2019-09-16 DIAGNOSIS — E1122 Type 2 diabetes mellitus with diabetic chronic kidney disease: Secondary | ICD-10-CM | POA: Diagnosis not present

## 2019-09-16 DIAGNOSIS — I132 Hypertensive heart and chronic kidney disease with heart failure and with stage 5 chronic kidney disease, or end stage renal disease: Secondary | ICD-10-CM | POA: Diagnosis not present

## 2019-09-18 DIAGNOSIS — E1122 Type 2 diabetes mellitus with diabetic chronic kidney disease: Secondary | ICD-10-CM | POA: Diagnosis not present

## 2019-09-18 DIAGNOSIS — N186 End stage renal disease: Secondary | ICD-10-CM | POA: Diagnosis not present

## 2019-09-18 DIAGNOSIS — E785 Hyperlipidemia, unspecified: Secondary | ICD-10-CM | POA: Diagnosis not present

## 2019-09-18 DIAGNOSIS — I132 Hypertensive heart and chronic kidney disease with heart failure and with stage 5 chronic kidney disease, or end stage renal disease: Secondary | ICD-10-CM | POA: Diagnosis not present

## 2019-09-18 DIAGNOSIS — D631 Anemia in chronic kidney disease: Secondary | ICD-10-CM | POA: Diagnosis not present

## 2019-09-18 DIAGNOSIS — I5030 Unspecified diastolic (congestive) heart failure: Secondary | ICD-10-CM | POA: Diagnosis not present

## 2019-09-23 DIAGNOSIS — I132 Hypertensive heart and chronic kidney disease with heart failure and with stage 5 chronic kidney disease, or end stage renal disease: Secondary | ICD-10-CM | POA: Diagnosis not present

## 2019-09-23 DIAGNOSIS — E785 Hyperlipidemia, unspecified: Secondary | ICD-10-CM | POA: Diagnosis not present

## 2019-09-23 DIAGNOSIS — N186 End stage renal disease: Secondary | ICD-10-CM | POA: Diagnosis not present

## 2019-09-23 DIAGNOSIS — D631 Anemia in chronic kidney disease: Secondary | ICD-10-CM | POA: Diagnosis not present

## 2019-09-23 DIAGNOSIS — I5030 Unspecified diastolic (congestive) heart failure: Secondary | ICD-10-CM | POA: Diagnosis not present

## 2019-09-23 DIAGNOSIS — E1122 Type 2 diabetes mellitus with diabetic chronic kidney disease: Secondary | ICD-10-CM | POA: Diagnosis not present

## 2019-09-25 DIAGNOSIS — D631 Anemia in chronic kidney disease: Secondary | ICD-10-CM | POA: Diagnosis not present

## 2019-09-25 DIAGNOSIS — E785 Hyperlipidemia, unspecified: Secondary | ICD-10-CM | POA: Diagnosis not present

## 2019-09-25 DIAGNOSIS — N186 End stage renal disease: Secondary | ICD-10-CM | POA: Diagnosis not present

## 2019-09-25 DIAGNOSIS — I132 Hypertensive heart and chronic kidney disease with heart failure and with stage 5 chronic kidney disease, or end stage renal disease: Secondary | ICD-10-CM | POA: Diagnosis not present

## 2019-09-25 DIAGNOSIS — E1122 Type 2 diabetes mellitus with diabetic chronic kidney disease: Secondary | ICD-10-CM | POA: Diagnosis not present

## 2019-09-25 DIAGNOSIS — I5030 Unspecified diastolic (congestive) heart failure: Secondary | ICD-10-CM | POA: Diagnosis not present

## 2019-09-27 DIAGNOSIS — E785 Hyperlipidemia, unspecified: Secondary | ICD-10-CM | POA: Diagnosis not present

## 2019-09-27 DIAGNOSIS — I5030 Unspecified diastolic (congestive) heart failure: Secondary | ICD-10-CM | POA: Diagnosis not present

## 2019-09-27 DIAGNOSIS — D631 Anemia in chronic kidney disease: Secondary | ICD-10-CM | POA: Diagnosis not present

## 2019-09-27 DIAGNOSIS — I132 Hypertensive heart and chronic kidney disease with heart failure and with stage 5 chronic kidney disease, or end stage renal disease: Secondary | ICD-10-CM | POA: Diagnosis not present

## 2019-09-27 DIAGNOSIS — E1122 Type 2 diabetes mellitus with diabetic chronic kidney disease: Secondary | ICD-10-CM | POA: Diagnosis not present

## 2019-09-27 DIAGNOSIS — N186 End stage renal disease: Secondary | ICD-10-CM | POA: Diagnosis not present

## 2019-09-29 DIAGNOSIS — E785 Hyperlipidemia, unspecified: Secondary | ICD-10-CM | POA: Diagnosis not present

## 2019-09-29 DIAGNOSIS — D631 Anemia in chronic kidney disease: Secondary | ICD-10-CM | POA: Diagnosis not present

## 2019-09-29 DIAGNOSIS — E1122 Type 2 diabetes mellitus with diabetic chronic kidney disease: Secondary | ICD-10-CM | POA: Diagnosis not present

## 2019-09-29 DIAGNOSIS — I132 Hypertensive heart and chronic kidney disease with heart failure and with stage 5 chronic kidney disease, or end stage renal disease: Secondary | ICD-10-CM | POA: Diagnosis not present

## 2019-09-29 DIAGNOSIS — I5030 Unspecified diastolic (congestive) heart failure: Secondary | ICD-10-CM | POA: Diagnosis not present

## 2019-09-29 DIAGNOSIS — N186 End stage renal disease: Secondary | ICD-10-CM | POA: Diagnosis not present

## 2019-10-01 DIAGNOSIS — D631 Anemia in chronic kidney disease: Secondary | ICD-10-CM | POA: Diagnosis not present

## 2019-10-01 DIAGNOSIS — N186 End stage renal disease: Secondary | ICD-10-CM | POA: Diagnosis not present

## 2019-10-01 DIAGNOSIS — I132 Hypertensive heart and chronic kidney disease with heart failure and with stage 5 chronic kidney disease, or end stage renal disease: Secondary | ICD-10-CM | POA: Diagnosis not present

## 2019-10-01 DIAGNOSIS — E1122 Type 2 diabetes mellitus with diabetic chronic kidney disease: Secondary | ICD-10-CM | POA: Diagnosis not present

## 2019-10-01 DIAGNOSIS — I5030 Unspecified diastolic (congestive) heart failure: Secondary | ICD-10-CM | POA: Diagnosis not present

## 2019-10-01 DIAGNOSIS — E785 Hyperlipidemia, unspecified: Secondary | ICD-10-CM | POA: Diagnosis not present

## 2019-10-02 DIAGNOSIS — E1122 Type 2 diabetes mellitus with diabetic chronic kidney disease: Secondary | ICD-10-CM | POA: Diagnosis not present

## 2019-10-02 DIAGNOSIS — E785 Hyperlipidemia, unspecified: Secondary | ICD-10-CM | POA: Diagnosis not present

## 2019-10-02 DIAGNOSIS — N186 End stage renal disease: Secondary | ICD-10-CM | POA: Diagnosis not present

## 2019-10-02 DIAGNOSIS — I5030 Unspecified diastolic (congestive) heart failure: Secondary | ICD-10-CM | POA: Diagnosis not present

## 2019-10-02 DIAGNOSIS — D631 Anemia in chronic kidney disease: Secondary | ICD-10-CM | POA: Diagnosis not present

## 2019-10-02 DIAGNOSIS — I132 Hypertensive heart and chronic kidney disease with heart failure and with stage 5 chronic kidney disease, or end stage renal disease: Secondary | ICD-10-CM | POA: Diagnosis not present

## 2019-10-06 DIAGNOSIS — E785 Hyperlipidemia, unspecified: Secondary | ICD-10-CM | POA: Diagnosis not present

## 2019-10-06 DIAGNOSIS — N186 End stage renal disease: Secondary | ICD-10-CM | POA: Diagnosis not present

## 2019-10-06 DIAGNOSIS — I5030 Unspecified diastolic (congestive) heart failure: Secondary | ICD-10-CM | POA: Diagnosis not present

## 2019-10-06 DIAGNOSIS — I132 Hypertensive heart and chronic kidney disease with heart failure and with stage 5 chronic kidney disease, or end stage renal disease: Secondary | ICD-10-CM | POA: Diagnosis not present

## 2019-10-06 DIAGNOSIS — D631 Anemia in chronic kidney disease: Secondary | ICD-10-CM | POA: Diagnosis not present

## 2019-10-06 DIAGNOSIS — E1122 Type 2 diabetes mellitus with diabetic chronic kidney disease: Secondary | ICD-10-CM | POA: Diagnosis not present

## 2019-10-07 DIAGNOSIS — E785 Hyperlipidemia, unspecified: Secondary | ICD-10-CM | POA: Diagnosis not present

## 2019-10-07 DIAGNOSIS — N186 End stage renal disease: Secondary | ICD-10-CM | POA: Diagnosis not present

## 2019-10-07 DIAGNOSIS — D631 Anemia in chronic kidney disease: Secondary | ICD-10-CM | POA: Diagnosis not present

## 2019-10-07 DIAGNOSIS — I132 Hypertensive heart and chronic kidney disease with heart failure and with stage 5 chronic kidney disease, or end stage renal disease: Secondary | ICD-10-CM | POA: Diagnosis not present

## 2019-10-07 DIAGNOSIS — E1122 Type 2 diabetes mellitus with diabetic chronic kidney disease: Secondary | ICD-10-CM | POA: Diagnosis not present

## 2019-10-07 DIAGNOSIS — I5030 Unspecified diastolic (congestive) heart failure: Secondary | ICD-10-CM | POA: Diagnosis not present

## 2019-10-09 DIAGNOSIS — N186 End stage renal disease: Secondary | ICD-10-CM | POA: Diagnosis not present

## 2019-10-09 DIAGNOSIS — E785 Hyperlipidemia, unspecified: Secondary | ICD-10-CM | POA: Diagnosis not present

## 2019-10-09 DIAGNOSIS — E1122 Type 2 diabetes mellitus with diabetic chronic kidney disease: Secondary | ICD-10-CM | POA: Diagnosis not present

## 2019-10-09 DIAGNOSIS — D631 Anemia in chronic kidney disease: Secondary | ICD-10-CM | POA: Diagnosis not present

## 2019-10-09 DIAGNOSIS — I5030 Unspecified diastolic (congestive) heart failure: Secondary | ICD-10-CM | POA: Diagnosis not present

## 2019-10-09 DIAGNOSIS — I132 Hypertensive heart and chronic kidney disease with heart failure and with stage 5 chronic kidney disease, or end stage renal disease: Secondary | ICD-10-CM | POA: Diagnosis not present

## 2019-10-10 DIAGNOSIS — I132 Hypertensive heart and chronic kidney disease with heart failure and with stage 5 chronic kidney disease, or end stage renal disease: Secondary | ICD-10-CM | POA: Diagnosis not present

## 2019-10-10 DIAGNOSIS — E785 Hyperlipidemia, unspecified: Secondary | ICD-10-CM | POA: Diagnosis not present

## 2019-10-10 DIAGNOSIS — N186 End stage renal disease: Secondary | ICD-10-CM | POA: Diagnosis not present

## 2019-10-10 DIAGNOSIS — D631 Anemia in chronic kidney disease: Secondary | ICD-10-CM | POA: Diagnosis not present

## 2019-10-10 DIAGNOSIS — E1122 Type 2 diabetes mellitus with diabetic chronic kidney disease: Secondary | ICD-10-CM | POA: Diagnosis not present

## 2019-10-10 DIAGNOSIS — I5030 Unspecified diastolic (congestive) heart failure: Secondary | ICD-10-CM | POA: Diagnosis not present

## 2019-10-12 DIAGNOSIS — I132 Hypertensive heart and chronic kidney disease with heart failure and with stage 5 chronic kidney disease, or end stage renal disease: Secondary | ICD-10-CM | POA: Diagnosis not present

## 2019-10-12 DIAGNOSIS — D631 Anemia in chronic kidney disease: Secondary | ICD-10-CM | POA: Diagnosis not present

## 2019-10-12 DIAGNOSIS — I5030 Unspecified diastolic (congestive) heart failure: Secondary | ICD-10-CM | POA: Diagnosis not present

## 2019-10-12 DIAGNOSIS — E1122 Type 2 diabetes mellitus with diabetic chronic kidney disease: Secondary | ICD-10-CM | POA: Diagnosis not present

## 2019-10-12 DIAGNOSIS — N186 End stage renal disease: Secondary | ICD-10-CM | POA: Diagnosis not present

## 2019-10-12 DIAGNOSIS — E785 Hyperlipidemia, unspecified: Secondary | ICD-10-CM | POA: Diagnosis not present

## 2019-10-13 DEATH — deceased

## 2019-11-06 ENCOUNTER — Telehealth: Payer: Self-pay

## 2019-11-06 NOTE — Telephone Encounter (Signed)
Called and spoke w/pts son who stated that he has been deceased since 10/20/19. I will route this phone call to cynthia in medical records so that she may close this encounter.
